# Patient Record
Sex: Female | Born: 1935 | Race: White | Hispanic: No | Marital: Single | Smoking: Never smoker
Health system: Southern US, Community
[De-identification: ages and names within clinical notes are randomized; demographics above are authoritative.]

## PROBLEM LIST (undated history)

## (undated) DIAGNOSIS — N200 Calculus of kidney: Secondary | ICD-10-CM

## (undated) DIAGNOSIS — E119 Type 2 diabetes mellitus without complications: Secondary | ICD-10-CM

## (undated) DIAGNOSIS — M1990 Unspecified osteoarthritis, unspecified site: Secondary | ICD-10-CM

## (undated) DIAGNOSIS — M109 Gout, unspecified: Secondary | ICD-10-CM

## (undated) DIAGNOSIS — I10 Essential (primary) hypertension: Secondary | ICD-10-CM

## (undated) DIAGNOSIS — E785 Hyperlipidemia, unspecified: Secondary | ICD-10-CM

## (undated) HISTORY — PX: TONSILLECTOMY: SUR1361

## (undated) HISTORY — PX: APPENDECTOMY: SHX54

## (undated) HISTORY — PX: CHOLECYSTECTOMY: SHX55

---

## 2003-11-26 ENCOUNTER — Other Ambulatory Visit: Payer: Self-pay

## 2004-10-17 ENCOUNTER — Other Ambulatory Visit: Payer: Self-pay

## 2004-10-17 ENCOUNTER — Emergency Department: Payer: Self-pay | Admitting: Emergency Medicine

## 2005-03-04 ENCOUNTER — Ambulatory Visit: Payer: Self-pay | Admitting: Internal Medicine

## 2005-03-09 ENCOUNTER — Ambulatory Visit: Payer: Self-pay | Admitting: Internal Medicine

## 2005-04-27 ENCOUNTER — Ambulatory Visit: Payer: Self-pay | Admitting: Internal Medicine

## 2006-04-05 ENCOUNTER — Ambulatory Visit: Payer: Self-pay | Admitting: Internal Medicine

## 2006-09-18 ENCOUNTER — Emergency Department: Payer: Self-pay | Admitting: Internal Medicine

## 2007-04-06 ENCOUNTER — Emergency Department: Payer: Self-pay | Admitting: Emergency Medicine

## 2007-04-06 ENCOUNTER — Other Ambulatory Visit: Payer: Self-pay

## 2007-04-11 ENCOUNTER — Ambulatory Visit: Payer: Self-pay | Admitting: Internal Medicine

## 2007-05-20 ENCOUNTER — Other Ambulatory Visit: Payer: Self-pay

## 2007-05-20 ENCOUNTER — Emergency Department: Payer: Self-pay | Admitting: Unknown Physician Specialty

## 2007-09-07 ENCOUNTER — Emergency Department: Payer: Self-pay | Admitting: Emergency Medicine

## 2007-09-11 ENCOUNTER — Ambulatory Visit: Payer: Self-pay | Admitting: Urology

## 2007-09-13 ENCOUNTER — Emergency Department: Payer: Self-pay | Admitting: Emergency Medicine

## 2007-12-25 ENCOUNTER — Ambulatory Visit: Payer: Self-pay | Admitting: Internal Medicine

## 2007-12-29 ENCOUNTER — Other Ambulatory Visit: Payer: Self-pay

## 2007-12-29 ENCOUNTER — Emergency Department: Payer: Self-pay | Admitting: Emergency Medicine

## 2008-04-11 ENCOUNTER — Ambulatory Visit: Payer: Self-pay | Admitting: Internal Medicine

## 2008-10-01 ENCOUNTER — Emergency Department: Payer: Self-pay | Admitting: Emergency Medicine

## 2008-11-12 ENCOUNTER — Ambulatory Visit: Payer: Self-pay | Admitting: Unknown Physician Specialty

## 2009-04-17 ENCOUNTER — Ambulatory Visit: Payer: Self-pay | Admitting: Internal Medicine

## 2009-12-03 ENCOUNTER — Ambulatory Visit: Payer: Self-pay | Admitting: Ophthalmology

## 2010-02-25 ENCOUNTER — Ambulatory Visit: Payer: Self-pay | Admitting: Ophthalmology

## 2010-03-10 ENCOUNTER — Ambulatory Visit: Payer: Self-pay | Admitting: Internal Medicine

## 2010-03-31 ENCOUNTER — Ambulatory Visit: Payer: Self-pay | Admitting: Unknown Physician Specialty

## 2010-04-13 ENCOUNTER — Ambulatory Visit: Payer: Self-pay | Admitting: Internal Medicine

## 2010-04-21 ENCOUNTER — Ambulatory Visit: Payer: Self-pay | Admitting: Internal Medicine

## 2010-09-22 ENCOUNTER — Emergency Department: Payer: Self-pay | Admitting: Emergency Medicine

## 2010-11-24 ENCOUNTER — Emergency Department: Payer: Self-pay | Admitting: Emergency Medicine

## 2011-02-16 ENCOUNTER — Ambulatory Visit: Payer: Self-pay

## 2011-06-01 ENCOUNTER — Ambulatory Visit: Payer: Self-pay | Admitting: Internal Medicine

## 2012-06-02 ENCOUNTER — Ambulatory Visit: Payer: Self-pay | Admitting: Internal Medicine

## 2012-07-17 ENCOUNTER — Emergency Department: Payer: Self-pay | Admitting: Emergency Medicine

## 2012-10-06 ENCOUNTER — Emergency Department: Payer: Self-pay | Admitting: Emergency Medicine

## 2012-10-06 LAB — URINALYSIS, COMPLETE
Glucose,UR: NEGATIVE mg/dL (ref 0–75)
Ketone: NEGATIVE
Nitrite: NEGATIVE
Ph: 5 (ref 4.5–8.0)
Protein: NEGATIVE
RBC,UR: 1 /HPF (ref 0–5)
Specific Gravity: 1.015 (ref 1.003–1.030)
Squamous Epithelial: 4
WBC UR: 9 /HPF (ref 0–5)

## 2012-10-06 LAB — COMPREHENSIVE METABOLIC PANEL
Alkaline Phosphatase: 104 U/L (ref 50–136)
Bilirubin,Total: 0.3 mg/dL (ref 0.2–1.0)
Calcium, Total: 9.1 mg/dL (ref 8.5–10.1)
Chloride: 107 mmol/L (ref 98–107)
Co2: 20 mmol/L — ABNORMAL LOW (ref 21–32)
Creatinine: 1.56 mg/dL — ABNORMAL HIGH (ref 0.60–1.30)
EGFR (African American): 36 — ABNORMAL LOW
Potassium: 4.2 mmol/L (ref 3.5–5.1)
SGOT(AST): 22 U/L (ref 15–37)
SGPT (ALT): 19 U/L (ref 12–78)
Total Protein: 8.2 g/dL (ref 6.4–8.2)

## 2012-10-06 LAB — CBC
HCT: 42.5 % (ref 35.0–47.0)
MCH: 29.1 pg (ref 26.0–34.0)
MCHC: 32.4 g/dL (ref 32.0–36.0)
Platelet: 218 10*3/uL (ref 150–440)
WBC: 11.2 10*3/uL — ABNORMAL HIGH (ref 3.6–11.0)

## 2012-10-06 LAB — LIPASE, BLOOD: Lipase: 265 U/L (ref 73–393)

## 2012-10-09 ENCOUNTER — Emergency Department: Payer: Self-pay | Admitting: Emergency Medicine

## 2012-10-09 LAB — URINALYSIS, COMPLETE
Glucose,UR: NEGATIVE mg/dL (ref 0–75)
Nitrite: NEGATIVE
Ph: 5 (ref 4.5–8.0)
Protein: NEGATIVE

## 2012-10-09 LAB — COMPREHENSIVE METABOLIC PANEL
Albumin: 3.7 g/dL (ref 3.4–5.0)
Alkaline Phosphatase: 75 U/L (ref 50–136)
Creatinine: 1.58 mg/dL — ABNORMAL HIGH (ref 0.60–1.30)
EGFR (Non-African Amer.): 30 — ABNORMAL LOW
Glucose: 126 mg/dL — ABNORMAL HIGH (ref 65–99)
Osmolality: 282 (ref 275–301)
Potassium: 5 mmol/L (ref 3.5–5.1)
Total Protein: 7.8 g/dL (ref 6.4–8.2)

## 2012-10-09 LAB — CBC
MCH: 29.9 pg (ref 26.0–34.0)
MCHC: 33.4 g/dL (ref 32.0–36.0)
RBC: 4.42 10*6/uL (ref 3.80–5.20)
WBC: 9.2 10*3/uL (ref 3.6–11.0)

## 2012-10-10 LAB — TROPONIN I: Troponin-I: <0.02 ng/mL

## 2013-02-20 ENCOUNTER — Emergency Department: Payer: Self-pay | Admitting: Unknown Physician Specialty

## 2013-02-20 LAB — CBC
HCT: 36.7 % (ref 35.0–47.0)
MCV: 90 fL (ref 80–100)
RBC: 4.1 10*6/uL (ref 3.80–5.20)
WBC: 8.4 10*3/uL (ref 3.6–11.0)

## 2013-02-20 LAB — URINALYSIS, COMPLETE
Blood: NEGATIVE
Glucose,UR: NEGATIVE mg/dL (ref 0–75)
Hyaline Cast: 1
Ketone: NEGATIVE
Nitrite: NEGATIVE
Ph: 5 (ref 4.5–8.0)
Protein: NEGATIVE
Specific Gravity: 1.009 (ref 1.003–1.030)
Squamous Epithelial: 1
WBC UR: 1 /HPF (ref 0–5)

## 2013-02-20 LAB — COMPREHENSIVE METABOLIC PANEL
Albumin: 3.6 g/dL (ref 3.4–5.0)
Chloride: 111 mmol/L — ABNORMAL HIGH (ref 98–107)
Creatinine: 2.01 mg/dL — ABNORMAL HIGH (ref 0.60–1.30)
EGFR (Non-African Amer.): 23 — ABNORMAL LOW
Glucose: 159 mg/dL — ABNORMAL HIGH (ref 65–99)
Osmolality: 292 (ref 275–301)
Potassium: 4.1 mmol/L (ref 3.5–5.1)
SGOT(AST): 23 U/L (ref 15–37)
SGPT (ALT): 18 U/L (ref 12–78)
Total Protein: 7.4 g/dL (ref 6.4–8.2)

## 2013-02-20 LAB — TROPONIN I: Troponin-I: <0.02 ng/mL

## 2013-02-20 LAB — PRO B NATRIURETIC PEPTIDE: B-Type Natriuretic Peptide: 127 pg/mL (ref 0–450)

## 2013-03-08 ENCOUNTER — Emergency Department: Payer: Self-pay | Admitting: Emergency Medicine

## 2013-06-04 ENCOUNTER — Ambulatory Visit: Payer: Self-pay | Admitting: Internal Medicine

## 2014-06-10 ENCOUNTER — Ambulatory Visit: Payer: Self-pay | Admitting: Internal Medicine

## 2015-05-19 ENCOUNTER — Other Ambulatory Visit: Payer: Self-pay | Admitting: Internal Medicine

## 2015-05-19 DIAGNOSIS — Z1231 Encounter for screening mammogram for malignant neoplasm of breast: Secondary | ICD-10-CM

## 2015-06-12 ENCOUNTER — Ambulatory Visit: Payer: Self-pay

## 2015-06-23 ENCOUNTER — Other Ambulatory Visit: Payer: Self-pay | Admitting: Internal Medicine

## 2015-06-23 ENCOUNTER — Ambulatory Visit
Admission: RE | Admit: 2015-06-23 | Discharge: 2015-06-23 | Disposition: A | Discharge status: Home / Self Care | Payer: Medicare Other | Source: Ambulatory Visit | Attending: Internal Medicine | Admitting: Internal Medicine

## 2015-06-23 DIAGNOSIS — Z1231 Encounter for screening mammogram for malignant neoplasm of breast: Secondary | ICD-10-CM

## 2015-06-24 ENCOUNTER — Ambulatory Visit: Payer: Self-pay

## 2015-07-15 ENCOUNTER — Emergency Department
Admission: EM | Admit: 2015-07-15 | Discharge: 2015-07-15 | Disposition: A | Discharge status: Home / Self Care | Payer: Medicare Other | Attending: Emergency Medicine | Admitting: Emergency Medicine

## 2015-07-15 ENCOUNTER — Encounter: Payer: Self-pay | Admitting: *Deleted

## 2015-07-15 ENCOUNTER — Emergency Department: Payer: Medicare Other

## 2015-07-15 DIAGNOSIS — I10 Essential (primary) hypertension: Secondary | ICD-10-CM | POA: Diagnosis not present

## 2015-07-15 DIAGNOSIS — Z79899 Other long term (current) drug therapy: Secondary | ICD-10-CM | POA: Diagnosis not present

## 2015-07-15 DIAGNOSIS — Z87891 Personal history of nicotine dependence: Secondary | ICD-10-CM | POA: Diagnosis not present

## 2015-07-15 DIAGNOSIS — S0083XA Contusion of other part of head, initial encounter: Secondary | ICD-10-CM | POA: Diagnosis not present

## 2015-07-15 DIAGNOSIS — Z7982 Long term (current) use of aspirin: Secondary | ICD-10-CM | POA: Insufficient documentation

## 2015-07-15 DIAGNOSIS — Y9289 Other specified places as the place of occurrence of the external cause: Secondary | ICD-10-CM | POA: Diagnosis not present

## 2015-07-15 DIAGNOSIS — R55 Syncope and collapse: Secondary | ICD-10-CM | POA: Diagnosis present

## 2015-07-15 DIAGNOSIS — R42 Dizziness and giddiness: Secondary | ICD-10-CM | POA: Diagnosis not present

## 2015-07-15 DIAGNOSIS — Y998 Other external cause status: Secondary | ICD-10-CM | POA: Diagnosis not present

## 2015-07-15 DIAGNOSIS — Y9389 Activity, other specified: Secondary | ICD-10-CM | POA: Insufficient documentation

## 2015-07-15 DIAGNOSIS — W01198A Fall on same level from slipping, tripping and stumbling with subsequent striking against other object, initial encounter: Secondary | ICD-10-CM | POA: Insufficient documentation

## 2015-07-15 DIAGNOSIS — N390 Urinary tract infection, site not specified: Secondary | ICD-10-CM | POA: Insufficient documentation

## 2015-07-15 DIAGNOSIS — Z880 Allergy status to penicillin: Secondary | ICD-10-CM | POA: Diagnosis not present

## 2015-07-15 HISTORY — DX: Essential (primary) hypertension: I10

## 2015-07-15 HISTORY — DX: Gout, unspecified: M10.9

## 2015-07-15 HISTORY — DX: Calculus of kidney: N20.0

## 2015-07-15 LAB — CBC
HCT: 36.3 % (ref 35.0–47.0)
HEMOGLOBIN: 11.9 g/dL — AB (ref 12.0–16.0)
MCH: 29.6 pg (ref 26.0–34.0)
MCHC: 32.8 g/dL (ref 32.0–36.0)
MCV: 90.4 fL (ref 80.0–100.0)
Platelets: 188 10*3/uL (ref 150–440)
RBC: 4.01 MIL/uL (ref 3.80–5.20)
RDW: 14.2 % (ref 11.5–14.5)
WBC: 7.2 10*3/uL (ref 3.6–11.0)

## 2015-07-15 LAB — URINALYSIS COMPLETE WITH MICROSCOPIC (ARMC ONLY)
BILIRUBIN URINE: NEGATIVE
Glucose, UA: NEGATIVE mg/dL
HGB URINE DIPSTICK: NEGATIVE
Ketones, ur: NEGATIVE mg/dL
Nitrite: NEGATIVE
PH: 5 (ref 5.0–8.0)
PROTEIN: NEGATIVE mg/dL
Specific Gravity, Urine: 1.015 (ref 1.005–1.030)

## 2015-07-15 LAB — TROPONIN I

## 2015-07-15 LAB — BASIC METABOLIC PANEL
ANION GAP: 8 (ref 5–15)
BUN: 33 mg/dL — ABNORMAL HIGH (ref 6–20)
CALCIUM: 9.6 mg/dL (ref 8.9–10.3)
CHLORIDE: 110 mmol/L (ref 101–111)
CO2: 20 mmol/L — AB (ref 22–32)
CREATININE: 1.47 mg/dL — AB (ref 0.44–1.00)
GFR calc Af Amer: 37 mL/min — ABNORMAL LOW (ref >60)
GFR calc non Af Amer: 32 mL/min — ABNORMAL LOW (ref >60)
GLUCOSE: 168 mg/dL — AB (ref 65–99)
Potassium: 4.3 mmol/L (ref 3.5–5.1)
Sodium: 138 mmol/L (ref 135–145)

## 2015-07-15 MED ORDER — MECLIZINE HCL 25 MG PO TABS: 25.0000 mg | ORAL_TABLET | Freq: Three times a day (TID) | ORAL | Status: AC | PRN

## 2015-07-15 MED ORDER — LEVOFLOXACIN 750 MG PO TABS: 750.0000 mg | ORAL_TABLET | ORAL | Status: AC

## 2015-07-15 MED ORDER — DIAZEPAM 5 MG PO TABS
2.5000 mg | ORAL_TABLET | Freq: Once | ORAL | Status: AC
  Administered 2015-07-15: 2.5 mg via ORAL

## 2015-07-15 MED ORDER — LORAZEPAM 1 MG PO TABS
1.0000 mg | ORAL_TABLET | Freq: Once | ORAL | Status: DC
  Filled 2015-07-15: qty 1

## 2015-07-15 MED ORDER — DIAZEPAM 5 MG PO TABS
ORAL_TABLET | ORAL | Status: AC
  Filled 2015-07-15: qty 1

## 2015-07-15 MED ORDER — LEVOFLOXACIN 750 MG PO TABS
750.0000 mg | ORAL_TABLET | Freq: Once | ORAL | Status: AC
  Administered 2015-07-15: 750 mg via ORAL
  Filled 2015-07-15: qty 1

## 2015-07-15 NOTE — ED Notes (Signed)

## 2015-07-15 NOTE — ED Notes (Signed)
Pt escorted to MRI by [facility]

## 2015-07-15 NOTE — ED Notes (Signed)
Yesterday fell hitting head on something, does not know why,has contunsion on forehead,yesterday she woke up sitting in chair, no problems rest of the day, has slight nauseahas off and on chronic off balance, is on asa

## 2015-07-15 NOTE — ED Provider Notes (Signed)
[facility] Emergency Department Provider Note  ____________________________________________  Time seen: Approximately 615 PM  I have reviewed the triage vital signs and the nursing notes.   HISTORY  Chief Complaint Loss of Consciousness    HPI DOMANIQUE HUESMAN is a 81 y.o. female with a history of hypertension and kidney stones was presenting today with a fall yesterday. The patient says that yesterday upon waking she felt off-balance with a "funny feeling" in her head. She had that she was walking the refrigerator and then the next thing she knew she awoke in a chair in a different room. She says that she thinks she fell because she has a bump to her forehead. She denies losing any bowel or bladder continence. Denies any weakness or numbness at this time. Denies any chest pain or shortness of breath but does not recall the events just prior to the episode. She denies any headache. Denies any dizziness. She says that she has been off balance in the past and walks with a cane or a baseline. However her granddaughter is at the bedside and says that over the past 1-2 weeks patient is also had some memory loss. She says that the patient will call her several times a day to tell her the same thing. She says that this is new for the patient.Patient also reporting 1 week of nasal congestion with cough.   Past Medical History  Diagnosis Date  . Hypertension   . Kidney calculus   . Kidney stones   . Gout     There are no active problems to display for this patient.   Past Surgical History  Procedure Laterality Date  . Cholecystectomy    . Appendectomy      Current Outpatient Rx  Name  Route  Sig  Dispense  Refill  . allopurinol (ZYLOPRIM) 300 MG tablet   Oral   Take 300 mg by mouth daily.         Marland Kitchen aspirin EC 81 MG tablet   Oral   Take 81 mg by mouth daily.         . Cholecalciferol (VITAMIN D3) 2000 UNITS TABS   Oral   Take 2,000 Units by mouth at  bedtime.         . Coconut Oil (EQL COCONUT OIL) 1000 MG CAPS   Oral   Take 1,000 mg by mouth daily.         . diazepam (VALIUM) 5 MG tablet   Oral   Take 2.5 mg by mouth at bedtime as needed (for sleep).         Marland Kitchen glipiZIDE (GLUCOTROL XL) 5 MG 24 hr tablet   Oral   Take 5 mg by mouth daily.         Marland Kitchen ipratropium (ATROVENT) 0.03 % nasal spray   Each Nare   Place 2 sprays into both nostrils 2 (two) times daily.         Marland Kitchen ketoconazole (NIZORAL) 2 % shampoo   Topical   Apply 1 application topically every 14 (fourteen) days.         Marland Kitchen levothyroxine (SYNTHROID, LEVOTHROID) 25 MCG tablet   Oral   Take 25 mcg by mouth daily before breakfast.         . lisinopril (PRINIVIL,ZESTRIL) 20 MG tablet   Oral   Take 20 mg by mouth daily.         . metoprolol (LOPRESSOR) 50 MG tablet   Oral  Take 50 mg by mouth 2 (two) times daily.         . mometasone (ELOCON) 0.1 % lotion   Topical   Apply 1 application topically daily as needed (for itching on ears).         . Multiple Vitamin (MULTIVITAMIN WITH MINERALS) TABS tablet   Oral   Take 1 tablet by mouth daily.         . Omega-3 Fatty Acids (FISH OIL) 1000 MG CAPS   Oral   Take 1,000 mg by mouth 3 (three) times daily.         Marland Kitchen omeprazole (PRILOSEC) 20 MG capsule   Oral   Take 20 mg by mouth 2 (two) times daily.         . Red Yeast Rice 600 MG CAPS   Oral   Take 600 mg by mouth daily.         . sertraline (ZOLOFT) 25 MG tablet   Oral   Take 25 mg by mouth daily.           Allergies Demerol and Penicillins  Family History  Problem Relation Age of Onset  . Breast cancer Neg Hx     Social History Social History  Substance Use Topics  . Smoking status: Former Games developer  . Smokeless tobacco: None  . Alcohol Use: No    Review of Systems Constitutional: No fever/chills Eyes: No visual changes. ENT: No sore throat. Cardiovascular: Denies chest pain. Respiratory: Denies shortness of  breath. Gastrointestinal: No abdominal pain.  No nausea, no vomiting.  No diarrhea.  No constipation. Genitourinary: Negative for dysuria. Musculoskeletal: Negative for back pain. Skin: Negative for rash. Neurological: Negative for headaches, focal weakness or numbness.  10-point ROS otherwise negative.  ____________________________________________   PHYSICAL EXAM:  VITAL SIGNS: ED Triage Vitals  Enc Vitals Group     BP 07/15/15 1154 154/68 mmHg     Pulse Rate 07/15/15 1154 73     Resp 07/15/15 1154 20     Temp 07/15/15 1154 98.1 F (36.7 C)     Temp Source 07/15/15 1154 Oral     SpO2 07/15/15 1154 96 %     Weight 07/15/15 1154 185 lb (83.915 kg)     Height 07/15/15 1154  (1.626 m)     Head Cir --      Peak Flow --      Pain Score 07/15/15 1155 3     Pain Loc --      Pain Edu? --      Excl. in GC? --     Constitutional: Alert and oriented. Well appearing and in no acute distress. Eyes: Conjunctivae are normal. PERRL. EOMI. Head: Small hematoma which is 2 x 3 cm the left forehead. There is no abrasion overlying. No bogginess or skull depression. Normal TMs bilaterally. Nose: Small amount of congestion to the bilateral naris Mouth/Throat: Mucous membranes are moist.  Oropharynx non-erythematous. Neck: No stridor.   Cardiovascular: Normal rate, regular rhythm. Grossly normal heart sounds.  Good peripheral circulation. Respiratory: Normal respiratory effort.  No retractions. Lungs CTAB. Gastrointestinal: Soft and nontender. No distention. No abdominal bruits. No CVA tenderness. Musculoskeletal: No lower extremity tenderness nor edema.  No joint effusions. Neurologic:  Normal speech and language. No gross focal neurologic deficits are appreciated. No ataxia on finger to nose testing as well as heel-to-shin movements. Patient able to walk without any assistance but when stops she says that she feels off balance and starts  to waver.  Skin:  Skin is warm, dry and intact. No  rash noted. Psychiatric: Mood and affect are normal. Speech and behavior are normal.  NIH Stroke Scale   Person Administering Scale: Arelia Longest  Administer stroke scale items in the order listed. Record performance in each category after each subscale exam. Do not go back and change scores. Follow directions provided for each exam technique. Scores should reflect what the patient does, not what the clinician thinks the patient can do. The clinician should record answers while administering the exam and work quickly. Except where indicated, the patient should not be coached (i.e., repeated requests to patient to make a special effort).   1a  Level of consciousness: 0=alert; keenly responsive  1b. LOC questions:  0=Performs both tasks correctly  1c. LOC commands: 0=Performs both tasks correctly  2.  Best Gaze: 0=normal  3.  Visual: 0=No visual loss  4. Facial Palsy: 0=Normal symmetric movement  5a.  Motor left arm: 0=No drift, limb holds 90 (or 45) degrees for full 10 seconds  5b.  Motor right arm: 0=No drift, limb holds 90 (or 45) degrees for full 10 seconds  6a. motor left leg: 0=No drift, limb holds 90 (or 45) degrees for full 10 seconds  6b  Motor right leg:  0=No drift, limb holds 90 (or 45) degrees for full 10 seconds  7. Limb Ataxia: 0=Absent  8.  Sensory: 0=Normal; no sensory loss  9. Best Language:  0=No aphasia, normal  10. Dysarthria: 0=Normal  11. Extinction and Inattention: 0=No abnormality  12. Distal motor function: 0=Normal   Total:   0   ____________________________________________   LABS (all labs ordered are listed, but only abnormal results are displayed)  Labs Reviewed  BASIC METABOLIC PANEL - Abnormal; Notable for the following:    CO2 20 (*)    Glucose, Bld 168 (*)    BUN 33 (*)    Creatinine, Ser 1.47 (*)    GFR calc non Af Amer 32 (*)    GFR calc Af Amer 37 (*)    All other components within normal limits  CBC - Abnormal; Notable for the  following:    Hemoglobin 11.9 (*)    All other components within normal limits  URINALYSIS COMPLETEWITH MICROSCOPIC ([facility] ONLY) - Abnormal; Notable for the following:    Color, Urine YELLOW (*)    APPearance CLOUDY (*)    Leukocytes, UA 3+ (*)    Bacteria, UA MANY (*)    Squamous Epithelial / LPF 6-30 (*)    All other components within normal limits  URINE CULTURE  TROPONIN I   ____________________________________________  EKG  ED ECG REPORT I, Arelia Longest, the attending physician, personally viewed and interpreted this ECG.   Date: 07/15/2015  EKG Time: 1202  Rate: 74  Rhythm: normal sinus rhythm, P waves seen in V2 as well as V3.  Axis: Left axis deviation  Intervals:none  ST&T Change: T wave inversions in V2 as well as V3 which are not changed from 02/20/2013.  ____________________________________________  RADIOLOGY  CT of the brain without any acute intracranial abnormality.  Chest x-ray without any acute infiltrate.  MRI without intracranial acute abnormality. ____________________________________________   PROCEDURES    ____________________________________________   INITIAL IMPRESSION / ASSESSMENT AND PLAN / ED COURSE  Pertinent labs & imaging results that were available during my care of the patient were reviewed by me and considered in my medical decision making (see chart for  details).  Patient out of the window for TPA given the symptoms started greater than 24 hours ago but also has a NIH score of 0 at this time.  ----------------------------------------- 7:15 PM on 07/15/2015 ----------------------------------------- Patient says she is dizzy episode while getting up from the table and MRI. However it is resolved at this time. I discussed with her the findings of her workup. It appears that she has a urinary tract infection. She said she just had a urinary tract infection about 1-2 months ago but has been struggling with this issue since  she has been a teenager. She says that she usually takes Levaquin which alleviates her symptoms. I will give her a course of Levaquin to go home with. I will also discharge her with a prescription for meclizine. The diagnosis of urinary tract infection or bleed makes clinical sense because of her confusion over the past week. She'll be staying with her granddaughter for the next several days until her symptoms improved. She will also follow with her doctor Dr. Marcello FennelHande.    ____________________________________________   FINAL CLINICAL IMPRESSION(S) / ED DIAGNOSES  Urinary tract infection.    Myrna Blazeravid Matthew Anevay Campanella, MD 07/15/15 [phone]

## 2015-07-15 NOTE — ED Notes (Signed)
Pt taken to XRay 

## 2015-07-17 LAB — URINE CULTURE

## 2015-11-03 ENCOUNTER — Emergency Department: Payer: Medicare Other

## 2015-11-03 ENCOUNTER — Emergency Department
Admission: EM | Admit: 2015-11-03 | Discharge: 2015-11-03 | Disposition: A | Discharge status: Home / Self Care | Payer: Medicare Other | Attending: Emergency Medicine | Admitting: Emergency Medicine

## 2015-11-03 DIAGNOSIS — Z7952 Long term (current) use of systemic steroids: Secondary | ICD-10-CM | POA: Insufficient documentation

## 2015-11-03 DIAGNOSIS — R112 Nausea with vomiting, unspecified: Secondary | ICD-10-CM

## 2015-11-03 DIAGNOSIS — Z880 Allergy status to penicillin: Secondary | ICD-10-CM | POA: Insufficient documentation

## 2015-11-03 DIAGNOSIS — Z7982 Long term (current) use of aspirin: Secondary | ICD-10-CM | POA: Insufficient documentation

## 2015-11-03 DIAGNOSIS — Z87891 Personal history of nicotine dependence: Secondary | ICD-10-CM | POA: Diagnosis not present

## 2015-11-03 DIAGNOSIS — Z79899 Other long term (current) drug therapy: Secondary | ICD-10-CM | POA: Diagnosis not present

## 2015-11-03 DIAGNOSIS — R1084 Generalized abdominal pain: Secondary | ICD-10-CM | POA: Diagnosis not present

## 2015-11-03 DIAGNOSIS — Z7984 Long term (current) use of oral hypoglycemic drugs: Secondary | ICD-10-CM | POA: Diagnosis not present

## 2015-11-03 DIAGNOSIS — I10 Essential (primary) hypertension: Secondary | ICD-10-CM | POA: Diagnosis not present

## 2015-11-03 DIAGNOSIS — R197 Diarrhea, unspecified: Secondary | ICD-10-CM | POA: Insufficient documentation

## 2015-11-03 LAB — COMPREHENSIVE METABOLIC PANEL
ALK PHOS: 60 U/L (ref 38–126)
ALT: 11 U/L — AB (ref 14–54)
AST: 21 U/L (ref 15–41)
Albumin: 4.3 g/dL (ref 3.5–5.0)
Anion gap: 9 (ref 5–15)
BUN: 36 mg/dL — AB (ref 6–20)
CALCIUM: 9.8 mg/dL (ref 8.9–10.3)
CO2: 24 mmol/L (ref 22–32)
CREATININE: 1.7 mg/dL — AB (ref 0.44–1.00)
Chloride: 108 mmol/L (ref 101–111)
GFR calc non Af Amer: 26 mL/min — ABNORMAL LOW (ref >60)
GFR, EST AFRICAN AMERICAN: 31 mL/min — AB (ref >60)
GLUCOSE: 173 mg/dL — AB (ref 65–99)
Potassium: 4.9 mmol/L (ref 3.5–5.1)
SODIUM: 141 mmol/L (ref 135–145)
Total Bilirubin: 0.5 mg/dL (ref 0.3–1.2)
Total Protein: 7.8 g/dL (ref 6.5–8.1)

## 2015-11-03 LAB — CBC
HCT: 40.8 % (ref 35.0–47.0)
Hemoglobin: 13.3 g/dL (ref 12.0–16.0)
MCH: 29.9 pg (ref 26.0–34.0)
MCHC: 32.7 g/dL (ref 32.0–36.0)
MCV: 91.4 fL (ref 80.0–100.0)
PLATELETS: 190 10*3/uL (ref 150–440)
RBC: 4.47 MIL/uL (ref 3.80–5.20)
RDW: 14.1 % (ref 11.5–14.5)
WBC: 9.8 10*3/uL (ref 3.6–11.0)

## 2015-11-03 LAB — TROPONIN I

## 2015-11-03 LAB — LIPASE, BLOOD: Lipase: 36 U/L (ref 11–51)

## 2015-11-03 MED ORDER — SODIUM CHLORIDE 0.9 % IV BOLUS (SEPSIS)
1000.0000 mL | Freq: Once | INTRAVENOUS | Status: AC
  Administered 2015-11-03: 1000 mL via INTRAVENOUS

## 2015-11-03 MED ORDER — IOHEXOL 240 MG/ML SOLN: 25.0000 mL | INTRAMUSCULAR | Status: AC

## 2015-11-03 NOTE — ED Provider Notes (Signed)
[facility] Emergency Department Provider Note  ____________________________________________  Time seen: Seen upon arrival to the emergency department  I have reviewed the triage vital signs and the nursing notes.   HISTORY  Chief Complaint Diarrhea and Emesis   HPI Victoria Edwards is a 82 y.o. female with a history of hypertension and kidney stones is presenting to the emergency department today with nausea vomiting, diarrhea and abdominal pain over the past one hour. She says she has had about 5 episodes of diarrhea which were watery over the past hour. She is also a 2-3 episodes of vomiting. She denies any blood in her diarrhea or vomitus. She says that she has had abdominal cramping which is especially pronounced in the right upper quadrant throughout the day. She has no known sick contacts. No recent antibiotics. Did receive a pneumonia shot recently. Denies any body aches. Says that the pain is cramping and mild without any radiation.   Past Medical History  Diagnosis Date  . Hypertension   . Kidney calculus   . Kidney stones   . Gout     There are no active problems to display for this patient.   Past Surgical History  Procedure Laterality Date  . Cholecystectomy    . Appendectomy      Current Outpatient Rx  Name  Route  Sig  Dispense  Refill  . allopurinol (ZYLOPRIM) 300 MG tablet   Oral   Take 300 mg by mouth daily.         Marland Kitchen. aspirin EC 81 MG tablet   Oral   Take 81 mg by mouth daily.         . Cholecalciferol (VITAMIN D3) 2000 UNITS TABS   Oral   Take 2,000 Units by mouth at bedtime.         . Coconut Oil (EQL COCONUT OIL) 1000 MG CAPS   Oral   Take 1,000 mg by mouth daily.         . diazepam (VALIUM) 5 MG tablet   Oral   Take 2.5 mg by mouth at bedtime as needed (for sleep).         Marland Kitchen. glipiZIDE (GLUCOTROL XL) 5 MG 24 hr tablet   Oral   Take 5 mg by mouth daily.         Marland Kitchen. ipratropium (ATROVENT) 0.03 % nasal  spray   Each Nare   Place 2 sprays into both nostrils 2 (two) times daily.         Marland Kitchen. ketoconazole (NIZORAL) 2 % shampoo   Topical   Apply 1 application topically every 14 (fourteen) days.         Marland Kitchen. levothyroxine (SYNTHROID, LEVOTHROID) 25 MCG tablet   Oral   Take 25 mcg by mouth daily before breakfast.         . lisinopril (PRINIVIL,ZESTRIL) 20 MG tablet   Oral   Take 20 mg by mouth daily.         . meclizine (ANTIVERT) 25 MG tablet   Oral   Take 1 tablet (25 mg total) by mouth 3 (three) times daily as needed for dizziness.   30 tablet   0   . metoprolol (LOPRESSOR) 50 MG tablet   Oral   Take 50 mg by mouth 2 (two) times daily.         . mometasone (ELOCON) 0.1 % lotion   Topical   Apply 1 application topically daily as needed (for itching on ears).         .Marland Kitchen  Multiple Vitamin (MULTIVITAMIN WITH MINERALS) TABS tablet   Oral   Take 1 tablet by mouth daily.         . Omega-3 Fatty Acids (FISH OIL) 1000 MG CAPS   Oral   Take 1,000 mg by mouth 3 (three) times daily.         Marland Kitchen omeprazole (PRILOSEC) 20 MG capsule   Oral   Take 20 mg by mouth 2 (two) times daily.         . Red Yeast Rice 600 MG CAPS   Oral   Take 600 mg by mouth daily.         . sertraline (ZOLOFT) 25 MG tablet   Oral   Take 25 mg by mouth daily.           Allergies Demerol and Penicillins  Family History  Problem Relation Age of Onset  . Breast cancer Neg Hx     Social History Social History  Substance Use Topics  . Smoking status: Former Games developer  . Smokeless tobacco: None  . Alcohol Use: No    Review of Systems Constitutional: No fever/chills Eyes: No visual changes. ENT: No sore throat. Cardiovascular: Denies chest pain. Respiratory: Denies shortness of breath. Gastrointestinal: No constipation. Genitourinary: Negative for dysuria. Musculoskeletal: Negative for back pain. Skin: Negative for rash. Neurological: Negative for headaches, focal weakness or  numbness.  10-point ROS otherwise negative.  ____________________________________________   PHYSICAL EXAM:  VITAL SIGNS: ED Triage Vitals  Enc Vitals Group     BP 11/03/15 1933 143/54 mmHg     Pulse Rate 11/03/15 1933 86     Resp 11/03/15 1933 16     Temp 11/03/15 1933 97.8 F (36.6 C)     Temp Source 11/03/15 1933 Oral     SpO2 11/03/15 1933 96 %     Weight 11/03/15 1933 175 lb (79.379 kg)     Height 11/03/15 1933  (1.626 m)     Head Cir --      Peak Flow --      Pain Score 11/03/15 1938 0     Pain Loc --      Pain Edu? --      Excl. in GC? --     Constitutional: Alert and oriented. Well appearing and in no acute distress. Eyes: Conjunctivae are normal. PERRL. EOMI. Head: Atraumatic. Nose: No congestion/rhinnorhea. Mouth/Throat: Mucous membranes are moist.   Neck: No stridor.   Cardiovascular: Normal rate, regular rhythm. Grossly normal heart sounds.  Good peripheral circulation. Respiratory: Normal respiratory effort.  No retractions. Lungs CTAB. Gastrointestinal: Soft with mild diffuse abdominal tenderness which is more pronounced than right upper quadrant without a positive Murphy sign. No distention.  No CVA tenderness. Musculoskeletal: No lower extremity tenderness nor edema.  No joint effusions. Neurologic:  Normal speech and language. No gross focal neurologic deficits are appreciated.  Skin:  Skin is warm, dry and intact. No rash noted. Psychiatric: Mood and affect are normal. Speech and behavior are normal.  ____________________________________________   LABS (all labs ordered are listed, but only abnormal results are displayed)  Labs Reviewed  COMPREHENSIVE METABOLIC PANEL - Abnormal; Notable for the following:    Glucose, Bld 173 (*)    BUN 36 (*)    Creatinine, Ser 1.70 (*)    ALT 11 (*)    GFR calc non Af Amer 26 (*)    GFR calc Af Amer 31 (*)    All other components within normal limits  C DIFFICILE QUICK SCREEN W PCR REFLEX  LIPASE,  BLOOD  CBC  TROPONIN I  URINALYSIS COMPLETEWITH MICROSCOPIC ([facility] ONLY)   ____________________________________________  EKG  ED ECG REPORT I, Schaevitz,  Teena Irani, the attending physician, personally viewed and interpreted this ECG.   Date: 11/03/2015  EKG Time: 2020  Rate: 90  Rhythm: normal sinus rhythm. Likely P waves in V3. Poor baseline likely obscuring P waves. Also evident in lead 3.  Axis: Normal  Intervals:none  ST&T Change: No ST elevation or depression. T-wave inversions in leads V2 and V3.  No significant change from EKG done on 07/15/2015. ____________________________________________  RADIOLOGY  No acute abnormality found on the CAT scan of the abdomen and pelvis. ____________________________________________   PROCEDURES   ____________________________________________   INITIAL IMPRESSION / ASSESSMENT AND PLAN / ED COURSE  Pertinent labs & imaging results that were available during my care of the patient were reviewed by me and considered in my medical decision making (see chart for details).  ----------------------------------------- 10:30 PM on 11/03/2015 -----------------------------------------  Patient is resting comfortably at this time and feels much improved after IV fluids. She is able to keep down all the by mouth contrast and has not had any episodes of nausea, vomiting or diarrhea in the emergency department. She said that the symptoms started this afternoon after eating [place] fried chicken.  It is possible that this is related to food poisoning as the symptoms have completely resolved after a brief period of nausea vomiting and diarrhea. She'll be discharged home. We discussed dietary precautions including starting simple with clear fluids as well as toast and other bland foods. The patient's granddaughter is at the bedside and she also understands the plan. ____________________________________________   FINAL CLINICAL IMPRESSION(S) / ED  DIAGNOSES  Final diagnoses:  Nausea & vomiting   abdominal pain. Diarrhea.    Myrna Blazer, MD 11/03/15 2232

## 2015-11-03 NOTE — ED Notes (Signed)
Pt arrives to ED via [facility] d/t N/V/D since 1pm this afternoon. EMS reports pt had a pneumonia shot on Saturday and felt fine. Pt reports feeling a little worse on Saturday before s/x's started today. EMS reports 4mg  Zofran given IM en route. Pt reports 4 episodes of emesis in the last 90 mins; 6-7 episodes of diarrhea since 4pm this afternoon. Pt is A&O, in NAD, with respiration even, regular and unlabored.

## 2015-11-03 NOTE — ED Notes (Signed)
Patient finished contrast.  CT notified. Still waiting on lab results.

## 2015-11-03 NOTE — Discharge Instructions (Signed)
Abdominal Pain, Adult Many things can cause abdominal pain. Usually, abdominal pain is not caused by a disease and will improve without treatment. It can often be observed and treated at home. Your health care provider will do a physical exam and possibly order blood tests and X-rays to help determine the seriousness of your pain. However, in many cases, more time must pass before a clear cause of the pain can be found. Before that point, your health care provider may not know if you need more testing or further treatment. HOME CARE INSTRUCTIONS Monitor your abdominal pain for any changes. The following actions may help to alleviate any discomfort you are experiencing:  Only take over-the-counter or prescription medicines as directed by your health care provider.  Do not take laxatives unless directed to do so by your health care provider.  Try a clear liquid diet (broth, tea, or water) as directed by your health care provider. Slowly move to a bland diet as tolerated. SEEK MEDICAL CARE IF:  You have unexplained abdominal pain.  You have abdominal pain associated with nausea or diarrhea.  You have pain when you urinate or have a bowel movement.  You experience abdominal pain that wakes you in the night.  You have abdominal pain that is worsened or improved by eating food.  You have abdominal pain that is worsened with eating fatty foods.  You have a fever. SEEK IMMEDIATE MEDICAL CARE IF:  Your pain does not go away within 2 hours.  You keep throwing up (vomiting).  Your pain is felt only in portions of the abdomen, such as the right side or the left lower portion of the abdomen.  You pass bloody or black tarry stools. MAKE SURE YOU:  Understand these instructions.  Will watch your condition.  Will get help right away if you are not doing well or get worse.   This information is not intended to replace advice given to you by your health care provider. Make sure you discuss  any questions you have with your health care provider.   Document Released: 05/26/2005 Document Revised: 05/07/2015 Document Reviewed: 04/25/2013 Elsevier Interactive Patient Education 2016 Elsevier Inc.  Diarrhea Diarrhea is watery poop (stool). It can make you feel weak, tired, thirsty, or give you a dry mouth (signs of dehydration). Watery poop is a sign of another problem, most often an infection. It often lasts 2-3 days. It can last longer if it is a sign of something serious. Take care of yourself as told by your doctor. HOME CARE   Drink 1 cup (8 ounces) of fluid each time you have watery poop.  Do not drink the following fluids:  Those that contain simple sugars (fructose, glucose, galactose, lactose, sucrose, maltose).  Sports drinks.  Fruit juices.  Whole milk products.  Sodas.  Drinks with caffeine (coffee, tea, soda) or alcohol.  Oral rehydration solution may be used if the doctor says it is okay. You may make your own solution. Follow this recipe:   - teaspoon table salt.   teaspoon baking soda.   teaspoon salt substitute containing potassium chloride.  1 tablespoons sugar.  1 liter (34 ounces) of water.  Avoid the following foods:  High fiber foods, such as raw fruits and vegetables.  Nuts, seeds, and whole grain breads and cereals.   Those that are sweetened with sugar alcohols (xylitol, sorbitol, mannitol).  Try eating the following foods:  Starchy foods, such as rice, toast, pasta, low-sugar cereal, oatmeal, baked potatoes, crackers, and  bagels.  Bananas.  Applesauce.  Eat probiotic-rich foods, such as yogurt and milk products that are fermented.  Wash your hands well after each time you have watery poop.  Only take medicine as told by your doctor.  Take a warm bath to help lessen burning or pain from having watery poop. GET HELP RIGHT AWAY IF:   You cannot drink fluids without throwing up (vomiting).  You keep throwing up.  You  have blood in your poop, or your poop looks black and tarry.  You do not pee (urinate) in 6-8 hours, or there is only a small amount of very dark pee.  You have belly (abdominal) pain that gets worse or stays in the same spot (localizes).  You are weak, dizzy, confused, or light-headed.  You have a very bad headache.  Your watery poop gets worse or does not get better.  You have a fever or lasting symptoms for more than 2-3 days.  You have a fever and your symptoms suddenly get worse. MAKE SURE YOU:   Understand these instructions.  Will watch your condition.  Will get help right away if you are not doing well or get worse.   This information is not intended to replace advice given to you by your health care provider. Make sure you discuss any questions you have with your health care provider.   Document Released: 02/02/2008 Document Revised: 09/06/2014 Document Reviewed: 04/23/2012 Elsevier Interactive Patient Education 2016 Elsevier Inc.  Nausea and Vomiting Nausea is a sick feeling that often comes before throwing up (vomiting). Vomiting is a reflex where stomach contents come out of your mouth. Vomiting can cause severe loss of body fluids (dehydration). Children and elderly adults can become dehydrated quickly, especially if they also have diarrhea. Nausea and vomiting are symptoms of a condition or disease. It is important to find the cause of your symptoms. CAUSES   Direct irritation of the stomach lining. This irritation can result from increased acid production (gastroesophageal reflux disease), infection, food poisoning, taking certain medicines (such as nonsteroidal anti-inflammatory drugs), alcohol use, or tobacco use.  Signals from the brain.These signals could be caused by a headache, heat exposure, an inner ear disturbance, increased pressure in the brain from injury, infection, a tumor, or a concussion, pain, emotional stimulus, or metabolic problems.  An  obstruction in the gastrointestinal tract (bowel obstruction).  Illnesses such as diabetes, hepatitis, gallbladder problems, appendicitis, kidney problems, cancer, sepsis, atypical symptoms of a heart attack, or eating disorders.  Medical treatments such as chemotherapy and radiation.  Receiving medicine that makes you sleep (general anesthetic) during surgery. DIAGNOSIS Your caregiver may ask for tests to be done if the problems do not improve after a few days. Tests may also be done if symptoms are severe or if the reason for the nausea and vomiting is not clear. Tests may include:  Urine tests.  Blood tests.  Stool tests.  Cultures (to look for evidence of infection).  X-rays or other imaging studies. Test results can help your caregiver make decisions about treatment or the need for additional tests. TREATMENT You need to stay well hydrated. Drink frequently but in small amounts.You may wish to drink water, sports drinks, clear broth, or eat frozen ice pops or gelatin dessert to help stay hydrated.When you eat, eating slowly may help prevent nausea.There are also some antinausea medicines that may help prevent nausea. HOME CARE INSTRUCTIONS   Take all medicine as directed by your caregiver.  If you do not  have an appetite, do not force yourself to eat. However, you must continue to drink fluids.  If you have an appetite, eat a normal diet unless your caregiver tells you differently.  Eat a variety of complex carbohydrates (rice, wheat, potatoes, bread), lean meats, yogurt, fruits, and vegetables.  Avoid high-fat foods because they are more difficult to digest.  Drink enough water and fluids to keep your urine clear or pale yellow.  If you are dehydrated, ask your caregiver for specific rehydration instructions. Signs of dehydration may include:  Severe thirst.  Dry lips and mouth.  Dizziness.  Dark urine.  Decreasing urine frequency and  amount.  Confusion.  Rapid breathing or pulse. SEEK IMMEDIATE MEDICAL CARE IF:   You have blood or brown flecks (like coffee grounds) in your vomit.  You have black or bloody stools.  You have a severe headache or stiff neck.  You are confused.  You have severe abdominal pain.  You have chest pain or trouble breathing.  You do not urinate at least once every 8 hours.  You develop cold or clammy skin.  You continue to vomit for longer than 24 to 48 hours.  You have a fever. MAKE SURE YOU:   Understand these instructions.  Will watch your condition.  Will get help right away if you are not doing well or get worse.   This information is not intended to replace advice given to you by your health care provider. Make sure you discuss any questions you have with your health care provider.   Document Released: 08/16/2005 Document Revised: 11/08/2011 Document Reviewed: 01/13/2011 Elsevier Interactive Patient Education [employer]

## 2015-11-03 NOTE — ED Notes (Signed)
Patient transported to ct

## 2016-03-24 ENCOUNTER — Encounter: Payer: Self-pay | Admitting: Emergency Medicine

## 2016-03-24 ENCOUNTER — Emergency Department
Admission: EM | Admit: 2016-03-24 | Discharge: 2016-03-24 | Disposition: A | Discharge status: Home / Self Care | Payer: Medicare Other | Attending: Emergency Medicine | Admitting: Emergency Medicine

## 2016-03-24 DIAGNOSIS — I10 Essential (primary) hypertension: Secondary | ICD-10-CM | POA: Insufficient documentation

## 2016-03-24 DIAGNOSIS — Z7984 Long term (current) use of oral hypoglycemic drugs: Secondary | ICD-10-CM | POA: Insufficient documentation

## 2016-03-24 DIAGNOSIS — Z7982 Long term (current) use of aspirin: Secondary | ICD-10-CM | POA: Insufficient documentation

## 2016-03-24 DIAGNOSIS — H9201 Otalgia, right ear: Secondary | ICD-10-CM | POA: Diagnosis present

## 2016-03-24 DIAGNOSIS — Z79899 Other long term (current) drug therapy: Secondary | ICD-10-CM | POA: Diagnosis not present

## 2016-03-24 DIAGNOSIS — Z87891 Personal history of nicotine dependence: Secondary | ICD-10-CM | POA: Diagnosis not present

## 2016-03-24 DIAGNOSIS — H6691 Otitis media, unspecified, right ear: Secondary | ICD-10-CM | POA: Diagnosis not present

## 2016-03-24 MED ORDER — SULFAMETHOXAZOLE-TRIMETHOPRIM 800-160 MG PO TABS
1.0000 | ORAL_TABLET | Freq: Once | ORAL | Status: AC
  Administered 2016-03-24: 1 via ORAL
  Filled 2016-03-24: qty 1

## 2016-03-24 MED ORDER — SULFAMETHOXAZOLE-TRIMETHOPRIM 800-160 MG PO TABS: 1.0000 | ORAL_TABLET | Freq: Two times a day (BID) | ORAL | 0 refills | Status: DC

## 2016-03-24 NOTE — ED Provider Notes (Signed)
[facility] Emergency Department Provider Note ____________________________________________  Time seen: Approximately 9:51 PM  I have reviewed the triage vital signs and the nursing notes.   HISTORY  Chief Complaint Otalgia    HPI Victoria Edwards is a 82 y.o. female who presents to the emergency department for evaluation of right ear pain since yesterday. She states that she has had quite a bit of sinus drainage for about the last month. She denies fever. She states that she has recently had drainage and matting in both of her eyes upon awakening in the morning and she was given some salve for infection.  Past Medical History:  Diagnosis Date  . Gout   . Hypertension   . Kidney calculus   . Kidney stones     There are no active problems to display for this patient.   Past Surgical History:  Procedure Laterality Date  . APPENDECTOMY    . CHOLECYSTECTOMY      Current Outpatient Rx  . Order #: [id number] Class: Historical Med  . Order #: [id number] Class: Historical Med  . Order #: [id number] Class: Historical Med  . Order #: [id number] Class: Historical Med  . Order #: [id number] Class: Historical Med  . Order #: [id number] Class: Historical Med  . Order #: [id number] Class: Historical Med  . Order #: [id number] Class: Historical Med  . Order #: [id number] Class: Historical Med  . Order #: [id number] Class: Historical Med  . Order #: [id number] Class: Print  . Order #: [id number] Class: Historical Med  . Order #: [id number] Class: Historical Med  . Order #: [id number] Class: Historical Med  . Order #: [id number] Class: Historical Med  . Order #: [id number] Class: Historical Med  . Order #: [id number] Class: Historical Med  . Order #: [id number] Class: Historical Med  . Order #: [id number] Class: Print    Allergies Demerol [meperidine] and Penicillins  Family History  Problem Relation Age of Onset  . Breast cancer Neg Hx     Social History Social History  Substance Use  Topics  . Smoking status: Former Games developer  . Smokeless tobacco: Never Used  . Alcohol use No    Review of Systems Constitutional: Negative for fever/chills Eyes: No visual changes. ENT: Positive for right earache. Gastrointestinal: No abdominal pain.  No nausea, no vomiting.  No diarrhea.  No constipation. Musculoskeletal: Negative for pain. Skin: Negative for rash. Neurological: Negative for headaches, focal weakness or numbness. ____________________________________________   PHYSICAL EXAM:  VITAL SIGNS: ED Triage Vitals  Enc Vitals Group     BP 03/24/16 2124 136/63     Pulse Rate 03/24/16 2124 69     Resp 03/24/16 2124 18     Temp 03/24/16 2124 98.4 F (36.9 C)     Temp Source 03/24/16 2124 Oral     SpO2 03/24/16 2124 95 %     Weight 03/24/16 2124 180 lb (81.6 kg)     Height 03/24/16 2124  (1.6 m)     Head Circumference --      Peak Flow --      Pain Score 03/24/16 2125 5     Pain Loc --      Pain Edu? --      Excl. in GC? --     Constitutional: Alert and oriented. Well appearing and in no acute distress. Eyes: Conjunctivae are normal. PERRL. EOMI. Ears: No pain with movement of either auricle:; External canal patent bilaterally;  Right TM erythematous, dull; Left TM normal   Head: Atraumatic. Nose: No congestion/rhinnorhea. Mouth/Throat: Mucous membranes are moist.  Oropharynx  non-erythematous. Hematological/Lymphatic/Immunilogical: No cervical lymphadenopathy. Cardiovascular: Normal capillary refill. Respiratory: Normal respiratory effort.  No retractions.  Neurologic:  Normal speech and language. No gross focal neurologic deficits are appreciated. Speech is normal. No gait instability. Skin:  Skin is warm, dry and intact. No rash noted. ____________________________________________   LABS (all labs ordered are listed, but only abnormal results are displayed)  Labs Reviewed - No data to  display ____________________________________________   RADIOLOGY  Not indicated ____________________________________________   PROCEDURES  Procedure(s) performed: None  ____________________________________________   INITIAL IMPRESSION / ASSESSMENT AND PLAN / ED COURSE  Pertinent labs & imaging results that were available during my care of the patient were reviewed by me and considered in my medical decision making (see chart for details).  Prescription for Bactrim will be given today.  The patient was advised to follow up with Dr. Andee Poles for symptoms that are not improving over the next 48 hours. She was advised to return to the emergency department for symptoms that change or worsen if unable to schedule an appointment.  ____________________________________________   FINAL CLINICAL IMPRESSION(S) / ED DIAGNOSES  Final diagnoses:  Acute right otitis media, recurrence not specified, unspecified otitis media type    Note:  This document was prepared using Dragon voice recognition software and may include unintentional dictation errors.     Chinita Pester, FNP 03/24/16 2209    Phineas Semen, MD 03/24/16 [phone]

## 2016-03-24 NOTE — ED Notes (Signed)
Discharge instructions reviewed with patient. Patient verbalized understanding. Patient taken to lobby via wheelchair and helped into vehicle without difficulty.  

## 2016-03-24 NOTE — ED Notes (Signed)
Pt presents to ED with c/o RIGHT earache and Right temporal area of head hurting shooting pains lasting 5-6sec since yesterday . (+) nasal drainage and congested. Pt denies running fever at home. Pt also c/o waking up with stiff neck for 2 days. Pt states on ABT ointment for eyes due to in am matted with drainage prescribed by eye MD. No acute distress noted.

## 2016-03-24 NOTE — ED Triage Notes (Signed)
Pt presents to ED with c/o RIGHT earache since yesterday. (+) nasal drainage and congested. Pt denies running fever at home. Pt also c/o waking up with stiff neck for 2 days. Pt alert and oriented x 4, no increased work in breathing noted.

## 2016-06-20 ENCOUNTER — Emergency Department
Admission: EM | Admit: 2016-06-20 | Discharge: 2016-06-21 | Disposition: A | Discharge status: Home / Self Care | Payer: Medicare Other | Attending: Student in an Organized Health Care Education/Training Program | Admitting: Student in an Organized Health Care Education/Training Program

## 2016-06-20 ENCOUNTER — Encounter: Payer: Self-pay | Admitting: Emergency Medicine

## 2016-06-20 ENCOUNTER — Emergency Department: Payer: Medicare Other

## 2016-06-20 DIAGNOSIS — I951 Orthostatic hypotension: Secondary | ICD-10-CM | POA: Insufficient documentation

## 2016-06-20 DIAGNOSIS — R410 Disorientation, unspecified: Secondary | ICD-10-CM | POA: Diagnosis not present

## 2016-06-20 DIAGNOSIS — Z7982 Long term (current) use of aspirin: Secondary | ICD-10-CM | POA: Diagnosis not present

## 2016-06-20 DIAGNOSIS — R4182 Altered mental status, unspecified: Secondary | ICD-10-CM | POA: Diagnosis present

## 2016-06-20 DIAGNOSIS — I639 Cerebral infarction, unspecified: Secondary | ICD-10-CM

## 2016-06-20 DIAGNOSIS — Z79899 Other long term (current) drug therapy: Secondary | ICD-10-CM | POA: Insufficient documentation

## 2016-06-20 LAB — COMPREHENSIVE METABOLIC PANEL
ALBUMIN: 4.1 g/dL (ref 3.5–5.0)
ALK PHOS: 58 U/L (ref 38–126)
ALT: 13 U/L — ABNORMAL LOW (ref 14–54)
AST: 22 U/L (ref 15–41)
Anion gap: 9 (ref 5–15)
BILIRUBIN TOTAL: 0.5 mg/dL (ref 0.3–1.2)
BUN: 25 mg/dL — AB (ref 6–20)
CALCIUM: 9.3 mg/dL (ref 8.9–10.3)
CO2: 25 mmol/L (ref 22–32)
Chloride: 102 mmol/L (ref 101–111)
Creatinine, Ser: 1.5 mg/dL — ABNORMAL HIGH (ref 0.44–1.00)
GFR calc Af Amer: 35 mL/min — ABNORMAL LOW (ref >60)
GFR calc non Af Amer: 31 mL/min — ABNORMAL LOW (ref >60)
GLUCOSE: 123 mg/dL — AB (ref 65–99)
Potassium: 4.3 mmol/L (ref 3.5–5.1)
Sodium: 136 mmol/L (ref 135–145)
TOTAL PROTEIN: 7.4 g/dL (ref 6.5–8.1)

## 2016-06-20 LAB — URINALYSIS COMPLETE WITH MICROSCOPIC (ARMC ONLY)
BACTERIA UA: NONE SEEN
Bilirubin Urine: NEGATIVE
GLUCOSE, UA: NEGATIVE mg/dL
HGB URINE DIPSTICK: NEGATIVE
Ketones, ur: NEGATIVE mg/dL
Nitrite: NEGATIVE
Protein, ur: NEGATIVE mg/dL
Specific Gravity, Urine: 1.013 (ref 1.005–1.030)
pH: 6 (ref 5.0–8.0)

## 2016-06-20 LAB — CBC WITH DIFFERENTIAL/PLATELET
BASOS ABS: 0 10*3/uL (ref 0–0.1)
BASOS PCT: 1 %
Eosinophils Absolute: 0.2 10*3/uL (ref 0–0.7)
Eosinophils Relative: 3 %
HEMATOCRIT: 38.7 % (ref 35.0–47.0)
HEMOGLOBIN: 12.8 g/dL (ref 12.0–16.0)
Lymphocytes Relative: 33 %
Lymphs Abs: 2.5 10*3/uL (ref 1.0–3.6)
MCH: 30.6 pg (ref 26.0–34.0)
MCHC: 33.1 g/dL (ref 32.0–36.0)
MCV: 92.3 fL (ref 80.0–100.0)
Monocytes Absolute: 0.5 10*3/uL (ref 0.2–0.9)
Monocytes Relative: 7 %
NEUTROS ABS: 4.2 10*3/uL (ref 1.4–6.5)
NEUTROS PCT: 56 %
Platelets: 195 10*3/uL (ref 150–440)
RBC: 4.19 MIL/uL (ref 3.80–5.20)
RDW: 14.5 % (ref 11.5–14.5)
WBC: 7.5 10*3/uL (ref 3.6–11.0)

## 2016-06-20 LAB — GLUCOSE, CAPILLARY: GLUCOSE-CAPILLARY: 125 mg/dL — AB (ref 65–99)

## 2016-06-20 LAB — TROPONIN I: Troponin I: <0.03 ng/mL (ref <0.03)

## 2016-06-20 MED ORDER — ONDANSETRON 4 MG PO TBDP
ORAL_TABLET | ORAL | Status: AC
  Administered 2016-06-20: 4 mg via ORAL
  Filled 2016-06-20: qty 1

## 2016-06-20 MED ORDER — ONDANSETRON 4 MG PO TBDP
4.0000 mg | ORAL_TABLET | Freq: Once | ORAL | Status: AC
  Administered 2016-06-20: 4 mg via ORAL

## 2016-06-20 MED ORDER — ACETAMINOPHEN 500 MG PO TABS
1000.0000 mg | ORAL_TABLET | Freq: Once | ORAL | Status: AC
  Administered 2016-06-20: 1000 mg via ORAL
  Filled 2016-06-20: qty 2

## 2016-06-20 NOTE — ED Provider Notes (Signed)
[facility] Emergency Department Provider Note    First MD Initiated Contact with Patient 06/20/16 2112     (approximate)  I have reviewed the triage vital signs and the nursing notes.   HISTORY  Chief Complaint Altered Mental Status    HPI Victoria Edwards is a 82 y.o. female who presents with brief episode of confusion and disorientation while the patient was going to bed. Patient states that she adamantly remembers reading her Bible and while moving to kneel to Victoria Edwards bedside should be getting confused and disoriented feeling that the TV was far off and she did not recognize her room. States this lasted roughly 15 seconds and then she became scared. There is no associated numbness or tingling. No headaches. No shortness of breath or chest pain or abdominal pain.  She denies any previous history of strokes. No history of dysrhythmia. She does take a daily aspirin. She currently feels well except for a mild headache behind her left eye. Denies any visual disturbances. She otherwise has no complaints.   Past Medical History:  Diagnosis Date  . Gout   . Hypertension   . Kidney calculus   . Kidney stones     There are no active problems to display for this patient.   Past Surgical History:  Procedure Laterality Date  . APPENDECTOMY    . CHOLECYSTECTOMY      Prior to Admission medications   Medication Sig Start Date End Date Taking? Authorizing Provider  allopurinol (ZYLOPRIM) 300 MG tablet Take 300 mg by mouth daily.    Historical Provider, MD  aspirin EC 81 MG tablet Take 81 mg by mouth daily.    Historical Provider, MD  Cholecalciferol (VITAMIN D3) 2000 UNITS TABS Take 2,000 Units by mouth at bedtime.    Historical Provider, MD  Coconut Oil (EQL COCONUT OIL) 1000 MG CAPS Take 1,000 mg by mouth daily.    Historical Provider, MD  diazepam (VALIUM) 5 MG tablet Take 2.5 mg by mouth at bedtime as needed (for sleep).    Historical Provider, MD  glipiZIDE  (GLUCOTROL XL) 5 MG 24 hr tablet Take 5 mg by mouth daily.    Historical Provider, MD  ipratropium (ATROVENT) 0.03 % nasal spray Place 2 sprays into both nostrils 2 (two) times daily.    Historical Provider, MD  ketoconazole (NIZORAL) 2 % shampoo Apply 1 application topically every 14 (fourteen) days.    Historical Provider, MD  levothyroxine (SYNTHROID, LEVOTHROID) 25 MCG tablet Take 25 mcg by mouth daily before breakfast.    Historical Provider, MD  lisinopril (PRINIVIL,ZESTRIL) 20 MG tablet Take 20 mg by mouth daily.    Historical Provider, MD  meclizine (ANTIVERT) 25 MG tablet Take 1 tablet (25 mg total) by mouth 3 (three) times daily as needed for dizziness. 07/15/15   Myrna Blazeravid Matthew Schaevitz, MD  metoprolol (LOPRESSOR) 50 MG tablet Take 50 mg by mouth 2 (two) times daily.    Historical Provider, MD  mometasone (ELOCON) 0.1 % lotion Apply 1 application topically daily as needed (for itching on ears).    Historical Provider, MD  Multiple Vitamin (MULTIVITAMIN WITH MINERALS) TABS tablet Take 1 tablet by mouth daily.    Historical Provider, MD  Omega-3 Fatty Acids (FISH OIL) 1000 MG CAPS Take 1,000 mg by mouth 3 (three) times daily.    Historical Provider, MD  omeprazole (PRILOSEC) 20 MG capsule Take 20 mg by mouth 2 (two) times daily.    Historical Provider, MD  Red Yeast Rice 600 MG CAPS Take 600 mg by mouth daily.    Historical Provider, MD  sertraline (ZOLOFT) 25 MG tablet Take 25 mg by mouth daily.    Historical Provider, MD  sulfamethoxazole-trimethoprim (BACTRIM DS,SEPTRA DS) 800-160 MG tablet Take 1 tablet by mouth 2 (two) times daily. 03/24/16   Chinita Pester, FNP    Allergies Demerol [meperidine]; Shellfish allergy; and Penicillins  Family History  Problem Relation Age of Onset  . Breast cancer Neg Hx     Social History Social History  Substance Use Topics  . Smoking status: Never Smoker  . Smokeless tobacco: Never Used  . Alcohol use No    Review of Systems Patient  denies headaches, rhinorrhea, blurry vision, numbness, shortness of breath, chest pain, edema, cough, abdominal pain, nausea, vomiting, diarrhea, dysuria, fevers, rashes or hallucinations unless otherwise stated above in HPI. ____________________________________________   PHYSICAL EXAM:  VITAL SIGNS: Vitals:   06/20/16 1951  BP: (!) 178/74  Pulse: 68  Resp: 18  Temp: 98.1 F (36.7 C)    Constitutional: Alert and oriented. Well appearing and in no acute distress. Eyes: Conjunctivae are normal. PERRL. EOMI. Head: Atraumatic. Nose: No congestion/rhinnorhea. Mouth/Throat: Mucous membranes are moist.  Oropharynx non-erythematous. Neck: No stridor. Painless ROM. No cervical spine tenderness to palpation Hematological/Lymphatic/Immunilogical: No cervical lymphadenopathy. Cardiovascular: Normal rate, regular rhythm. Grossly normal heart sounds.  Good peripheral circulation. Respiratory: Normal respiratory effort.  No retractions. Lungs CTAB. Gastrointestinal: Soft and nontender. No distention. No abdominal bruits. No CVA tenderness. Genitourinary:  Musculoskeletal: No lower extremity tenderness nor edema.  No joint effusions. Neurologic:  CN- intact.  No facial droop, Normal FNF.  Normal heel to shin.  Sensation intact bilaterally. Normal speech and language. No gross focal neurologic deficits are appreciated. No gait instability.  Skin:  Skin is warm, dry and intact. No rash noted. Psychiatric: Mood and affect are normal. Speech and behavior are normal.  ____________________________________________   LABS (all labs ordered are listed, but only abnormal results are displayed)  Results for orders placed or performed during the hospital encounter of 06/20/16 (from the past 24 hour(s))  Glucose, capillary     Status: Abnormal   Collection Time: 06/20/16  8:00 PM  Result Value Ref Range   Glucose-Capillary 125 (H) 65 - 99 mg/dL  CBC with Differential     Status: None   Collection  Time: 06/20/16  8:08 PM  Result Value Ref Range   WBC 7.5 3.6 - 11.0 K/uL   RBC 4.19 3.80 - 5.20 MIL/uL   Hemoglobin 12.8 12.0 - 16.0 g/dL   HCT 91.4 78.2 - 95.6 %   MCV 92.3 80.0 - 100.0 fL   MCH 30.6 26.0 - 34.0 pg   MCHC 33.1 32.0 - 36.0 g/dL   RDW 21.3 08.6 - 57.8 %   Platelets 195 150 - 440 K/uL   Neutrophils Relative % 56 %   Neutro Abs 4.2 1.4 - 6.5 K/uL   Lymphocytes Relative 33 %   Lymphs Abs 2.5 1.0 - 3.6 K/uL   Monocytes Relative 7 %   Monocytes Absolute 0.5 0.2 - 0.9 K/uL   Eosinophils Relative 3 %   Eosinophils Absolute 0.2 0 - 0.7 K/uL   Basophils Relative 1 %   Basophils Absolute 0.0 0 - 0.1 K/uL  Comprehensive metabolic panel     Status: Abnormal   Collection Time: 06/20/16  8:08 PM  Result Value Ref Range   Sodium 136 135 - 145 mmol/L  Potassium 4.3 3.5 - 5.1 mmol/L   Chloride 102 101 - 111 mmol/L   CO2 25 22 - 32 mmol/L   Glucose, Bld 123 (H) 65 - 99 mg/dL   BUN 25 (H) 6 - 20 mg/dL   Creatinine, Ser 8.29 (H) 0.44 - 1.00 mg/dL   Calcium 9.3 8.9 - 56.2 mg/dL   Total Protein 7.4 6.5 - 8.1 g/dL   Albumin 4.1 3.5 - 5.0 g/dL   AST 22 15 - 41 U/L   ALT 13 (L) 14 - 54 U/L   Alkaline Phosphatase 58 38 - 126 U/L   Total Bilirubin 0.5 0.3 - 1.2 mg/dL   GFR calc non Af Amer 31 (L) >60 mL/min   GFR calc Af Amer 35 (L) >60 mL/min   Anion gap 9 5 - 15  Troponin I     Status: None   Collection Time: 06/20/16  8:08 PM  Result Value Ref Range   Troponin I <0.03 <0.03 ng/mL   ____________________________________________  EKG My review and personal interpretation at Time: 20:23   Indication: confusion  Rate: 70  Rhythm: nsr Axis: normal Other: nonspecific twave inversions in precordial leads that are consistent with previous 11/04/15 ____________________________________________  RADIOLOGY  I personally reviewed all radiographic images ordered to evaluate for the above acute complaints and reviewed radiology reports and findings.  These findings were personally  discussed with the patient.  Please see medical record for radiology report. ____________________________________________   PROCEDURES  Procedure(s) performed: none    Critical Care performed: no ____________________________________________   INITIAL IMPRESSION / ASSESSMENT AND PLAN / ED COURSE  Pertinent labs & imaging results that were available during my care of the patient were reviewed by me and considered in my medical decision making (see chart for details).  DDX: ams, tia, cva, electrolyte abd, dysrythmia  Victoria Edwards is a 82 y.o. who presents to the ED with brief episode of confusion. Her neuro exam is nonfocal and she has no deficits at this time. CT imaging without any evidence of acute ischemia or bleed. Electrolyte laboratory evaluation is reassuring. EKG shows no acute changes. Her history is fairly peculiar and will be very atypical for TIA but based on the increased risk factors given her age and vague history do feel that MRI MRA is clinically indicated to evaluate for any acute ischemia. Patient will be signed out to Dr. Manson Passey pending evaluation of MRI. If MRI is unremarkable patient is currently stable for outpatient follow-up.  Have discussed with the patient and available family all diagnostics and treatments performed thus far and all questions were answered to the best of my ability. The patient demonstrates understanding and agreement with plan.   Clinical Course     ____________________________________________   FINAL CLINICAL IMPRESSION(S) / ED DIAGNOSES  Final diagnoses:  Confusion      NEW MEDICATIONS STARTED DURING THIS VISIT:  New Prescriptions   No medications on file     Note:  This document was prepared using Dragon voice recognition software and may include unintentional dictation errors.    Willy Eddy, MD 06/21/16 [phone]

## 2016-06-20 NOTE — ED Notes (Signed)
Pt reports she had a period of time this even where she "did not feel right". States she felt "off balance" but denied dizziness. Pt does report a hx of vertigo.

## 2016-06-20 NOTE — ED Triage Notes (Signed)
Pt presents to ED via POV. Pt reports she was about to go to bed when she looked up and didn't know where she was. Pt states has since then resolved. Pt is alert and oriented x 4. Pt now c/o HA behind L eye. Pt states episode lasted approx 15 seconds at approx 530-6. Facial symmetry intact, grip strengths equal bilaterally, leg strengths equal bilaterally. Pt states she took 1/2 tablet of valium at home.

## 2016-06-21 ENCOUNTER — Emergency Department: Payer: Medicare Other

## 2016-06-21 MED ORDER — CEPHALEXIN 500 MG PO CAPS: 500.0000 mg | ORAL_CAPSULE | Freq: Two times a day (BID) | ORAL | 0 refills | Status: AC

## 2016-06-21 MED ORDER — LORAZEPAM 2 MG/ML IJ SOLN
1.0000 mg | Freq: Once | INTRAMUSCULAR | Status: AC
  Administered 2016-06-21: 1 mg via INTRAVENOUS
  Filled 2016-06-21: qty 1

## 2016-06-21 NOTE — ED Notes (Signed)
Pt returned from MRI °

## 2016-06-21 NOTE — ED Provider Notes (Signed)
I assumed care of the patient at 12:00 AM from Dr. Roxan Hockeyobinson MRI of the brain revealed no gross abnormality. Patient informed me that the reef episode of blurred vision occurred while changing positions lasting seconds. I suspect this is secondary to orthostatic hypotension given normal MRI.   Darci Currentandolph N Brown, MD 06/21/16 [phone]

## 2016-06-21 NOTE — ED Notes (Signed)
Reviewed d/c instructions, follow-up care with pt. Pt verbalized understanding 

## 2016-06-21 NOTE — ED Notes (Signed)
Discussed dx of UTI with pt. Pt reported hx of multiple UTIs, and that she was currently having frequent urination. Reviewed pt's d/c instructions, follow-up care. Pt verbalized understanding

## 2016-06-21 NOTE — ED Notes (Signed)
Patient transported to MRI 

## 2016-06-22 LAB — URINE CULTURE: Culture: NO GROWTH

## 2016-08-27 ENCOUNTER — Emergency Department
Admission: EM | Admit: 2016-08-27 | Discharge: 2016-08-27 | Disposition: A | Discharge status: Home / Self Care | Payer: Medicare Other | Attending: Emergency Medicine | Admitting: Emergency Medicine

## 2016-08-27 ENCOUNTER — Emergency Department: Payer: Medicare Other

## 2016-08-27 ENCOUNTER — Encounter: Payer: Self-pay | Admitting: *Deleted

## 2016-08-27 DIAGNOSIS — R1010 Upper abdominal pain, unspecified: Secondary | ICD-10-CM

## 2016-08-27 DIAGNOSIS — I10 Essential (primary) hypertension: Secondary | ICD-10-CM | POA: Insufficient documentation

## 2016-08-27 DIAGNOSIS — Z79899 Other long term (current) drug therapy: Secondary | ICD-10-CM | POA: Diagnosis not present

## 2016-08-27 DIAGNOSIS — Z7982 Long term (current) use of aspirin: Secondary | ICD-10-CM | POA: Insufficient documentation

## 2016-08-27 DIAGNOSIS — Z7984 Long term (current) use of oral hypoglycemic drugs: Secondary | ICD-10-CM | POA: Diagnosis not present

## 2016-08-27 LAB — COMPREHENSIVE METABOLIC PANEL
ALBUMIN: 3.8 g/dL (ref 3.5–5.0)
ALK PHOS: 61 U/L (ref 38–126)
ALT: 10 U/L — ABNORMAL LOW (ref 14–54)
ANION GAP: 10 (ref 5–15)
AST: 33 U/L (ref 15–41)
BILIRUBIN TOTAL: 0.3 mg/dL (ref 0.3–1.2)
BUN: 22 mg/dL — AB (ref 6–20)
CALCIUM: 8.8 mg/dL — AB (ref 8.9–10.3)
CO2: 24 mmol/L (ref 22–32)
Chloride: 101 mmol/L (ref 101–111)
Creatinine, Ser: 1.33 mg/dL — ABNORMAL HIGH (ref 0.44–1.00)
GFR calc Af Amer: 41 mL/min — ABNORMAL LOW (ref >60)
GFR, EST NON AFRICAN AMERICAN: 35 mL/min — AB (ref >60)
GLUCOSE: 159 mg/dL — AB (ref 65–99)
Potassium: 4.4 mmol/L (ref 3.5–5.1)
Sodium: 135 mmol/L (ref 135–145)
TOTAL PROTEIN: 6.9 g/dL (ref 6.5–8.1)

## 2016-08-27 LAB — CBC
HEMATOCRIT: 37.1 % (ref 35.0–47.0)
HEMOGLOBIN: 12.6 g/dL (ref 12.0–16.0)
MCH: 30.2 pg (ref 26.0–34.0)
MCHC: 33.9 g/dL (ref 32.0–36.0)
MCV: 89.1 fL (ref 80.0–100.0)
Platelets: 178 10*3/uL (ref 150–440)
RBC: 4.16 MIL/uL (ref 3.80–5.20)
RDW: 14 % (ref 11.5–14.5)
WBC: 6.2 10*3/uL (ref 3.6–11.0)

## 2016-08-27 LAB — URINALYSIS, ROUTINE W REFLEX MICROSCOPIC
Bilirubin Urine: NEGATIVE
Glucose, UA: NEGATIVE mg/dL
Hgb urine dipstick: NEGATIVE
Ketones, ur: NEGATIVE mg/dL
Nitrite: NEGATIVE
Protein, ur: NEGATIVE mg/dL
SPECIFIC GRAVITY, URINE: 1.013 (ref 1.005–1.030)
pH: 6 (ref 5.0–8.0)

## 2016-08-27 LAB — LIPASE, BLOOD: LIPASE: 32 U/L (ref 11–51)

## 2016-08-27 LAB — TROPONIN I

## 2016-08-27 MED ORDER — FOSFOMYCIN TROMETHAMINE 3 G PO PACK
3.0000 g | PACK | Freq: Once | ORAL | Status: AC
  Administered 2016-08-27: 3 g via ORAL
  Filled 2016-08-27: qty 3

## 2016-08-27 NOTE — ED Triage Notes (Signed)
Pt arrived to ED from home reporting sudden onset of upper abd pain beginning at 1400 today. Pt verbalized pain was sharp and started on the eft side and radiated to the right side. Pt no longer in pain. Pt denies NVD and SOB. Pt alert and oriented x 4.

## 2016-08-27 NOTE — ED Notes (Signed)
Patient transported to X-ray 

## 2016-08-27 NOTE — ED Provider Notes (Signed)
[facility] Emergency Department Provider Note  Time seen: 4:10 PM  I have reviewed the triage vital signs and the nursing notes.   HISTORY  Chief Complaint Abdominal Pain    HPI Victoria Edwards is a 82 y.o. female with a past medical history of hypertension who presents to the emergency department for upper abdominal pain. According to the patient approximately one hour after lunch she developed a sharp pain across the upper abdomen. States it lasted one or 2 seconds, several minutes later she developed another pain again lasting 1 second. Patient called EMS for evaluation. Once EMS arrived the patient had an additional pain which also lasted approximately one second, sharp across the upper abdomen. Denies any nausea or vomiting at any time. Denies any diarrhea, chest pain or shortness of breath. Currently the patient is symptom-free, states she has not had a pain for over 30 minutes now. Denies any history of similar pain in the past. She states she feels normal currently but thought she should get checked out due to her age.  Past Medical History:  Diagnosis Date  . Gout   . Hypertension   . Kidney calculus   . Kidney stones     There are no active problems to display for this patient.   Past Surgical History:  Procedure Laterality Date  . APPENDECTOMY    . CHOLECYSTECTOMY    . TONSILLECTOMY      Prior to Admission medications   Medication Sig Start Date End Date Taking? Authorizing Provider  allopurinol (ZYLOPRIM) 300 MG tablet Take 300 mg by mouth daily.    Historical Provider, MD  aspirin EC 81 MG tablet Take 81 mg by mouth daily.    Historical Provider, MD  Cholecalciferol (VITAMIN D3) 2000 UNITS TABS Take 2,000 Units by mouth at bedtime.    Historical Provider, MD  Coconut Oil (EQL COCONUT OIL) 1000 MG CAPS Take 1,000 mg by mouth daily.    Historical Provider, MD  diazepam (VALIUM) 5 MG tablet Take 2.5 mg by mouth at bedtime as needed (for sleep).     Historical Provider, MD  glipiZIDE (GLUCOTROL XL) 5 MG 24 hr tablet Take 5 mg by mouth daily.    Historical Provider, MD  ipratropium (ATROVENT) 0.03 % nasal spray Place 2 sprays into both nostrils 2 (two) times daily.    Historical Provider, MD  ketoconazole (NIZORAL) 2 % shampoo Apply 1 application topically every 14 (fourteen) days.    Historical Provider, MD  levothyroxine (SYNTHROID, LEVOTHROID) 25 MCG tablet Take 25 mcg by mouth daily before breakfast.    Historical Provider, MD  lisinopril (PRINIVIL,ZESTRIL) 20 MG tablet Take 20 mg by mouth daily.    Historical Provider, MD  meclizine (ANTIVERT) 25 MG tablet Take 1 tablet (25 mg total) by mouth 3 (three) times daily as needed for dizziness. 07/15/15   Myrna Blazeravid Matthew Schaevitz, MD  metoprolol (LOPRESSOR) 50 MG tablet Take 50 mg by mouth 2 (two) times daily.    Historical Provider, MD  mometasone (ELOCON) 0.1 % lotion Apply 1 application topically daily as needed (for itching on ears).    Historical Provider, MD  Multiple Vitamin (MULTIVITAMIN WITH MINERALS) TABS tablet Take 1 tablet by mouth daily.    Historical Provider, MD  Omega-3 Fatty Acids (FISH OIL) 1000 MG CAPS Take 1,000 mg by mouth 3 (three) times daily.    Historical Provider, MD  omeprazole (PRILOSEC) 20 MG capsule Take 20 mg by mouth 2 (two) times daily.  Historical Provider, MD  Red Yeast Rice 600 MG CAPS Take 600 mg by mouth daily.    Historical Provider, MD  sertraline (ZOLOFT) 25 MG tablet Take 25 mg by mouth daily.    Historical Provider, MD  sulfamethoxazole-trimethoprim (BACTRIM DS,SEPTRA DS) 800-160 MG tablet Take 1 tablet by mouth 2 (two) times daily. 03/24/16   Chinita Pesterari B Triplett, FNP    Allergies  Allergen Reactions  . Demerol [Meperidine] Other (See Comments)    Reaction:  Confusion   . Shellfish Allergy   . Penicillins Itching, Rash and Other (See Comments)    Has patient had a PCN reaction causing immediate rash, facial/tongue/throat swelling, SOB or  lightheadedness with hypotension: No Has patient had a PCN reaction causing severe rash involving mucus membranes or skin necrosis: No Has patient had a PCN reaction that required hospitalization No Has patient had a PCN reaction occurring within the last 10 years: No If all of the above answers are "NO", then may proceed with Cephalosporin use.    Family History  Problem Relation Age of Onset  . Breast cancer Neg Hx     Social History Social History  Substance Use Topics  . Smoking status: Never Smoker  . Smokeless tobacco: Never Used  . Alcohol use No    Review of Systems Constitutional: Negative for fever. Cardiovascular: Negative for chest pain. Respiratory: Negative for shortness of breath. Gastrointestinal: Sharp upper abdominal pain, gone currently. Denies nausea, vomiting, diarrhea. Genitourinary: Negative for dysuria. Neurological: Negative for headache 10-point ROS otherwise negative.  ____________________________________________   PHYSICAL EXAM:  Constitutional: Alert and oriented. Well appearing and in no distress. Eyes: Normal exam ENT   Head: Normocephalic and atraumatic   Mouth/Throat: Mucous membranes are moist. Cardiovascular: Normal rate, regular rhythm. Respiratory: Normal respiratory effort without tachypnea nor retractions. Breath sounds are clear  Gastrointestinal: Soft and nontender. No distention.  Musculoskeletal: Nontender with normal range of motion in all extremities.  Neurologic:  Normal speech and language. No gross focal neurologic deficits Skin:  Skin is warm, dry and intact.  Psychiatric: Mood and affect are normal.   ____________________________________________    EKG  EKG reviewed and interpreted by myself shows normal sinus rhythm at 62 bpm, narrow QRS, normal axis, normal intervals, nonspecific ST changes.  ____________________________________________    RADIOLOGY  X-ray  negative  ____________________________________________   INITIAL IMPRESSION / ASSESSMENT AND PLAN / ED COURSE  Pertinent labs & imaging results that were available during my care of the patient were reviewed by me and considered in my medical decision making (see chart for details).  Overall very well-appearing patient, presents for abdominal pain which she describes as sharp lasting 1 second and then dissipating. Denies any discomfort currently. We will check labs including abdominal and cardiac labs, chest x-ray, and closely monitor.  Patient workup is largely within normal limits, labs are at her baseline, patient does have moderate leukocytes on her urinalysis but no white blood cells seen. Given moderate leukocytes with rare bacteria we will cover with a one-time dose of fosfomycin. I have added a urine culture. I do not suspect this is related to her upper abdominal pain however. Patient has remained pain-free throughout her stay in the emergency department. Her labs and x-ray are within normal limits/baseline. We will discharge home with PCP follow-up. I discussed my normal abdominal pain return precautions.  ____________________________________________   FINAL CLINICAL IMPRESSION(S) / ED DIAGNOSES  Upper abdominal pain    Minna AntisKevin Rhonda Linan, MD 08/27/16 1746

## 2016-08-27 NOTE — Discharge Instructions (Signed)
Please follow-up with her primary care doctor says possible regarding today's emergency department visit for abdominal pain. Return to the emergency department for any worsening abdominal pain, or any other symptom personally concerning to yourself.

## 2016-08-29 LAB — URINE CULTURE

## 2016-09-30 ENCOUNTER — Other Ambulatory Visit: Payer: Self-pay | Admitting: Internal Medicine

## 2016-09-30 DIAGNOSIS — Z1231 Encounter for screening mammogram for malignant neoplasm of breast: Secondary | ICD-10-CM

## 2016-10-29 ENCOUNTER — Ambulatory Visit: Payer: Medicare Other

## 2016-11-04 ENCOUNTER — Ambulatory Visit
Admission: RE | Admit: 2016-11-04 | Discharge: 2016-11-04 | Disposition: A | Discharge status: Home / Self Care | Payer: Medicare Other | Source: Ambulatory Visit | Attending: Internal Medicine | Admitting: Internal Medicine

## 2016-11-04 DIAGNOSIS — Z1231 Encounter for screening mammogram for malignant neoplasm of breast: Secondary | ICD-10-CM

## 2017-12-13 ENCOUNTER — Other Ambulatory Visit: Payer: Self-pay | Admitting: Internal Medicine

## 2017-12-13 DIAGNOSIS — Z1231 Encounter for screening mammogram for malignant neoplasm of breast: Secondary | ICD-10-CM

## 2017-12-30 ENCOUNTER — Ambulatory Visit
Admission: RE | Admit: 2017-12-30 | Discharge: 2017-12-30 | Disposition: A | Discharge status: Home / Self Care | Payer: Medicare Other | Source: Ambulatory Visit | Attending: Internal Medicine | Admitting: Internal Medicine

## 2017-12-30 DIAGNOSIS — Z1231 Encounter for screening mammogram for malignant neoplasm of breast: Secondary | ICD-10-CM | POA: Insufficient documentation

## 2018-01-02 ENCOUNTER — Other Ambulatory Visit: Payer: Self-pay | Admitting: Internal Medicine

## 2018-01-02 DIAGNOSIS — N6489 Other specified disorders of breast: Secondary | ICD-10-CM

## 2018-01-02 DIAGNOSIS — R928 Other abnormal and inconclusive findings on diagnostic imaging of breast: Secondary | ICD-10-CM

## 2018-01-12 ENCOUNTER — Ambulatory Visit
Admission: RE | Admit: 2018-01-12 | Discharge: 2018-01-12 | Disposition: A | Discharge status: Home / Self Care | Payer: Medicare Other | Source: Ambulatory Visit | Attending: Internal Medicine | Admitting: Internal Medicine

## 2018-01-12 DIAGNOSIS — N6489 Other specified disorders of breast: Secondary | ICD-10-CM | POA: Diagnosis present

## 2018-01-12 DIAGNOSIS — R928 Other abnormal and inconclusive findings on diagnostic imaging of breast: Secondary | ICD-10-CM | POA: Insufficient documentation

## 2018-07-28 ENCOUNTER — Other Ambulatory Visit: Payer: Self-pay

## 2018-07-28 ENCOUNTER — Encounter: Payer: Self-pay | Admitting: Emergency Medicine

## 2018-07-28 DIAGNOSIS — M546 Pain in thoracic spine: Secondary | ICD-10-CM | POA: Insufficient documentation

## 2018-07-28 DIAGNOSIS — Z7984 Long term (current) use of oral hypoglycemic drugs: Secondary | ICD-10-CM | POA: Insufficient documentation

## 2018-07-28 DIAGNOSIS — Z79899 Other long term (current) drug therapy: Secondary | ICD-10-CM | POA: Insufficient documentation

## 2018-07-28 DIAGNOSIS — Z7982 Long term (current) use of aspirin: Secondary | ICD-10-CM | POA: Insufficient documentation

## 2018-07-28 DIAGNOSIS — I10 Essential (primary) hypertension: Secondary | ICD-10-CM | POA: Insufficient documentation

## 2018-07-28 NOTE — ED Triage Notes (Signed)
Pt arrives via [facility] with back pain that has been going on for several days. Per EMS, [facility]. Pt reports that she noticed pain after she took the trash down to the road last week. Granddaughter reports that pt often has UTI. Pt giving urine sample at this time. Pt reports fall 2-3 weeks ago. Pt is in NAD.

## 2018-07-29 ENCOUNTER — Emergency Department
Admission: EM | Admit: 2018-07-29 | Discharge: 2018-07-29 | Disposition: A | Discharge status: Home / Self Care | Payer: Medicare Other | Attending: Emergency Medicine | Admitting: Emergency Medicine

## 2018-07-29 ENCOUNTER — Emergency Department: Payer: Medicare Other

## 2018-07-29 DIAGNOSIS — M546 Pain in thoracic spine: Secondary | ICD-10-CM

## 2018-07-29 LAB — URINALYSIS, COMPLETE (UACMP) WITH MICROSCOPIC
Bilirubin Urine: NEGATIVE
Glucose, UA: NEGATIVE mg/dL
HGB URINE DIPSTICK: NEGATIVE
KETONES UR: NEGATIVE mg/dL
NITRITE: NEGATIVE
PROTEIN: 30 mg/dL — AB
Specific Gravity, Urine: 1.013 (ref 1.005–1.030)
pH: 5 (ref 5.0–8.0)

## 2018-07-29 MED ORDER — LIDOCAINE 5 % EX PTCH
2.0000 | MEDICATED_PATCH | CUTANEOUS | Status: DC
  Administered 2018-07-29: 2 via TRANSDERMAL
  Filled 2018-07-29: qty 2

## 2018-07-29 MED ORDER — TRAMADOL HCL 50 MG PO TABS: 50.0000 mg | ORAL_TABLET | Freq: Four times a day (QID) | ORAL | 0 refills | Status: AC | PRN

## 2018-07-29 MED ORDER — TRAMADOL HCL 50 MG PO TABS
100.0000 mg | ORAL_TABLET | Freq: Once | ORAL | Status: AC
  Administered 2018-07-29: 100 mg via ORAL
  Filled 2018-07-29: qty 2

## 2018-07-29 MED ORDER — LIDOCAINE 5 % EX PTCH: 1.0000 | MEDICATED_PATCH | CUTANEOUS | 0 refills | Status: DC

## 2018-07-29 MED ORDER — FOSFOMYCIN TROMETHAMINE 3 G PO PACK
3.0000 g | PACK | Freq: Once | ORAL | Status: AC
  Administered 2018-07-29: 3 g via ORAL
  Filled 2018-07-29: qty 3

## 2018-07-29 NOTE — ED Notes (Signed)
Called pharmacy about lido patch.

## 2018-07-29 NOTE — ED Notes (Signed)
Assessment: pt states she had lower back pain"days ago". Pt states "it's now settled in my spine". Pt complains of pain to upper back extending up to base of skull. Pt denies other symptoms.

## 2018-07-29 NOTE — ED Notes (Signed)
Report to brittany, rn

## 2018-07-30 LAB — URINE CULTURE: Culture: NO GROWTH

## 2018-07-31 NOTE — ED Provider Notes (Signed)
[facility] Emergency Department Provider Note _________   First MD Initiated Contact with Patient 07/29/18 0300     (approximate)  I have reviewed the triage vital signs and the nursing notes.   HISTORY  Chief Complaint Back Pain   HPI Victoria Edwards is a 84 y.o. female with bolus of chronic medical conditions presents to the emergency department with upper back pain 3 days. Patient states that symptoms began after she "took her trash down the road last week. Patient also admits to a fall approximately 2 weeks ago. Patient denies any chest pain no shortness of breath. Patient describes the pain is aching worse with movement. She denies any fever. Patient denies any urinary symptoms.   Past Medical History:  Diagnosis Date  . Gout   . Hypertension   . Kidney calculus   . Kidney stones     There are no active problems to display for this patient.   Past Surgical History:  Procedure Laterality Date  . APPENDECTOMY    . CHOLECYSTECTOMY    . TONSILLECTOMY      Prior to Admission medications   Medication Sig Start Date End Date Taking? Authorizing Provider  allopurinol (ZYLOPRIM) 300 MG tablet Take 300 mg by mouth daily.    [provider]  aspirin EC 81 MG tablet Take 81 mg by mouth daily.    [provider]  Cholecalciferol (VITAMIN D3) 2000 UNITS TABS Take 2,000 Units by mouth at bedtime.    [provider]  Coconut Oil (EQL COCONUT OIL) 1000 MG CAPS Take 1,000 mg by mouth daily.    [provider]  diazepam (VALIUM) 5 MG tablet Take 2.5 mg by mouth at bedtime as needed (for sleep).    [provider]  glipiZIDE (GLUCOTROL XL) 5 MG 24 hr tablet Take 5 mg by mouth daily.    [provider]  ipratropium (ATROVENT) 0.03 % nasal spray Place 2 sprays into both nostrils 2 (two) times daily.    [provider]  ketoconazole (NIZORAL) 2 % shampoo Apply 1 application topically every 14  (fourteen) days.    [provider]  levothyroxine (SYNTHROID, LEVOTHROID) 25 MCG tablet Take 25 mcg by mouth daily before breakfast.    [provider]  lidocaine (LIDODERM) 5 % Place 1 patch onto the skin daily. Remove & Discard patch within 12 hours or as directed by MD 07/29/18   Darci Current, MD  lisinopril (PRINIVIL,ZESTRIL) 20 MG tablet Take 20 mg by mouth daily.    [provider]  meclizine (ANTIVERT) 25 MG tablet Take 1 tablet (25 mg total) by mouth 3 (three) times daily as needed for dizziness. 07/15/15   Myrna Blazer, MD  metoprolol (LOPRESSOR) 50 MG tablet Take 50 mg by mouth 2 (two) times daily.    [provider]  mometasone (ELOCON) 0.1 % lotion Apply 1 application topically daily as needed (for itching on ears).    [provider]  Multiple Vitamin (MULTIVITAMIN WITH MINERALS) TABS tablet Take 1 tablet by mouth daily.    [provider]  Omega-3 Fatty Acids (FISH OIL) 1000 MG CAPS Take 1,000 mg by mouth 3 (three) times daily.    [provider]  omeprazole (PRILOSEC) 20 MG capsule Take 20 mg by mouth 2 (two) times daily.    [provider]  Red Yeast Rice 600 MG CAPS Take 600 mg by mouth daily.    [provider]  sertraline (  ZOLOFT) 25 MG tablet Take 25 mg by mouth daily.    [provider]  sulfamethoxazole-trimethoprim (BACTRIM DS,SEPTRA DS) 800-160 MG tablet Take 1 tablet by mouth 2 (two) times daily. 03/24/16   Triplett, Rulon Eisenmengerari B, FNP  traMADol (ULTRAM) 50 MG tablet Take 1 tablet (50 mg total) by mouth every 6 (six) hours as needed. 07/29/18 07/29/19  Darci CurrentBrown, [place] N, MD    Allergies Demerol [meperidine]; Shellfish allergy; Statins; and Penicillins  Family History  Problem Relation Age of Onset  . Breast cancer Neg Hx     Social History Social History   Tobacco Use  . Smoking status: Never Smoker  . Smokeless tobacco: Never Used  Substance Use Topics  .  Alcohol use: No  . Drug use: No    Review of Systems Constitutional: No fever/chills Eyes: No visual changes. ENT: No sore throat. Cardiovascular: Denies chest pain. Respiratory: Denies shortness of breath. Gastrointestinal: No abdominal pain.  No nausea, no vomiting.  No diarrhea.  No constipation. Genitourinary: Negative for dysuria. Musculoskeletal: Negative for neck pain.  positive for back pain. Integumentary: Negative for rash. Neurological: Negative for headaches, focal weakness or numbness.   ____________________________________________   PHYSICAL EXAM:  VITAL SIGNS: ED Triage Vitals  Enc Vitals Group     BP 07/28/18 2350 (!) 150/86     Pulse Rate 07/28/18 2350 84     Resp 07/28/18 2350 18     Temp 07/28/18 2350 98.4 F (36.9 C)     Temp Source 07/28/18 2350 Oral     SpO2 07/28/18 2350 97 %     Weight 07/28/18 2348 92.5 kg (204 lb)     Height 07/28/18 2348 1.626 m (5\' 4" )     Head Circumference --      Peak Flow --      Pain Score 07/28/18 2350 7     Pain Loc --      Pain Edu? --      Excl. in GC? --     Constitutional: Alert and oriented. Well appearing and in no acute distress. Eyes: Conjunctivae are normal.  Head: Atraumatic. Mouth/Throat: Mucous membranes are moist.  Oropharynx non-erythematous. Neck: No stridor.  No meningeal signs.  No cervical spine tenderness to palpation. Cardiovascular: Normal rate, regular rhythm. Good peripheral circulation. Grossly normal heart sounds. Respiratory: Normal respiratory effort.  No retractions. Lungs CTAB. Gastrointestinal: Soft and nontender. No distention.  Musculoskeletal: No lower extremity tenderness nor edema. No gross deformities of extremities.pain with thoracic paraspinal muscle palpation. Neurologic:  Normal speech and language. No gross focal neurologic deficits are appreciated.  Skin:  Skin is warm, dry and intact. No rash noted. Psychiatric: Mood and affect are normal. Speech and behavior are  normal.  ____________________________________________   LABS (all labs ordered are listed, but only abnormal results are displayed)  Labs Reviewed  URINALYSIS, COMPLETE (UACMP) WITH MICROSCOPIC - Abnormal; Notable for the following components:      Result Value   Color, Urine YELLOW (*)    APPearance CLEAR (*)    Protein, ur 30 (*)    Leukocytes, UA SMALL (*)    Bacteria, UA RARE (*)    All other components within normal limits  URINE CULTURE   ______  RADIOLOGY I, [place] N , personally viewed and evaluated these images (plain radiographs) as part of my medical decision making, as well as reviewing the written report by the radiologist.  ED MD interpretation:  mild degenerative changes in the thoracic and lumbar spineper  radiologist    Procedures   ____________________________________________   INITIAL IMPRESSION / ASSESSMENT AND PLAN / ED COURSE  As part of my medical decision making, I reviewed the following data within the electronic MEDICAL RECORD NUMBER   84 year old female presenting with above stated history of physical exam secondary to upper back pain. Patient with no chest pain with pain that is positional. Lidoderm patch applied to the patient's thoracic spine tramadol 50 mg tablet given with complete resolution of pain patient's urinalysis concerning for possibility of UTI in a patient with multiple episodes of UTI in the past and a such she was given fosfomycin 3 g. ____________________________________________  FINAL CLINICAL IMPRESSION(S) / ED DIAGNOSES  Final diagnoses:  Acute bilateral thoracic back pain     MEDICATIONS GIVEN DURING THIS VISIT:  Medications  traMADol (ULTRAM) tablet 100 mg (100 mg Oral Given 07/29/18 0303)  fosfomycin (MONUROL) packet 3 g (3 g Oral Given 07/29/18 0410)     ED Discharge Orders         Ordered    lidocaine (LIDODERM) 5 %  Every 24 hours     07/29/18 0437    traMADol (ULTRAM) 50 MG tablet  Every 6 hours PRN      07/29/18 0437           Note:  This document was prepared using Dragon voice recognition software and may include unintentional dictation errors.    Darci Current, MD 07/31/18 2249

## 2020-07-23 ENCOUNTER — Emergency Department: Payer: Medicare Other

## 2020-07-23 ENCOUNTER — Other Ambulatory Visit: Payer: Self-pay

## 2020-07-23 ENCOUNTER — Emergency Department
Admission: EM | Admit: 2020-07-23 | Discharge: 2020-07-23 | Disposition: A | Discharge status: Home / Self Care | Payer: Medicare Other | Attending: Emergency Medicine | Admitting: Emergency Medicine

## 2020-07-23 DIAGNOSIS — Z7982 Long term (current) use of aspirin: Secondary | ICD-10-CM | POA: Insufficient documentation

## 2020-07-23 DIAGNOSIS — M25561 Pain in right knee: Secondary | ICD-10-CM | POA: Insufficient documentation

## 2020-07-23 DIAGNOSIS — Z79899 Other long term (current) drug therapy: Secondary | ICD-10-CM | POA: Diagnosis not present

## 2020-07-23 DIAGNOSIS — M25551 Pain in right hip: Secondary | ICD-10-CM | POA: Insufficient documentation

## 2020-07-23 DIAGNOSIS — M79661 Pain in right lower leg: Secondary | ICD-10-CM | POA: Diagnosis present

## 2020-07-23 DIAGNOSIS — M79604 Pain in right leg: Secondary | ICD-10-CM

## 2020-07-23 DIAGNOSIS — I10 Essential (primary) hypertension: Secondary | ICD-10-CM | POA: Diagnosis not present

## 2020-07-23 MED ORDER — TRAMADOL HCL 50 MG PO TABS
50.0000 mg | ORAL_TABLET | Freq: Once | ORAL | Status: AC
  Administered 2020-07-23: 50 mg via ORAL
  Filled 2020-07-23: qty 1

## 2020-07-23 MED ORDER — TRAMADOL HCL 50 MG PO TABS: 50.0000 mg | ORAL_TABLET | Freq: Three times a day (TID) | ORAL | 0 refills | Status: DC | PRN

## 2020-07-23 NOTE — ED Notes (Signed)
New RX and f/up discussed with pt. NAD noted.

## 2020-07-23 NOTE — ED Notes (Addendum)
Pt presents to ED via EMS with c/o of R hip and R knee pain. Pt denies fall or trauma to these areas. Pt states the R hip pain woke her up from her sleep. Pt denies numbness or tingling, R pedal pulse intact, CMS intact. Pt denies pain at this time while laying still but states she does have issues ambulating due to pain. Pt states she normally uses a walker to ambulate. Pt is A&Ox4 and states taking tylenol PTA but unsure of the dose.

## 2020-07-23 NOTE — ED Notes (Signed)
Pt assisted to bathroom

## 2020-07-23 NOTE — ED Provider Notes (Signed)
[facility] Emergency Department Provider Note  ____________________________________________   First MD Initiated Contact with Patient 07/23/20 1200     (approximate)  I have reviewed the triage vital signs and the nursing notes.   HISTORY  Chief Complaint Hip Pain    HPI Victoria Edwards is a 86 y.o. female presents to the ED via EMS with complaint of right hip and knee pain woke her up from her sleep this morning.  Patient denies any recent injury or fall.  Patient states that she normally uses a walker to ambulate however any movement of her right lower extremity increases her pain.  Patient has not taken any over-the-counter medication.  Currently lying still patient denies any pain.       Past Medical History:  Diagnosis Date  . Gout   . Hypertension   . Kidney calculus   . Kidney stones     There are no problems to display for this patient.   Past Surgical History:  Procedure Laterality Date  . APPENDECTOMY    . CHOLECYSTECTOMY    . TONSILLECTOMY      Prior to Admission medications   Medication Sig Start Date End Date Taking? Authorizing Provider  allopurinol (ZYLOPRIM) 300 MG tablet Take 300 mg by mouth daily.    [provider]  aspirin EC 81 MG tablet Take 81 mg by mouth daily.    [provider]  Cholecalciferol (VITAMIN D3) 2000 UNITS TABS Take 2,000 Units by mouth at bedtime.    [provider]  Coconut Oil (EQL COCONUT OIL) 1000 MG CAPS Take 1,000 mg by mouth daily.    [provider]  diazepam (VALIUM) 5 MG tablet Take 2.5 mg by mouth at bedtime as needed (for sleep).    [provider]  glipiZIDE (GLUCOTROL XL) 5 MG 24 hr tablet Take 5 mg by mouth daily.    [provider]  ipratropium (ATROVENT) 0.03 % nasal spray Place 2 sprays into both nostrils 2 (two) times daily.    [provider]  ketoconazole (NIZORAL) 2 % shampoo Apply 1 application topically every 14  (fourteen) days.    [provider]  levothyroxine (SYNTHROID, LEVOTHROID) 25 MCG tablet Take 25 mcg by mouth daily before breakfast.    [provider]  lidocaine (LIDODERM) 5 % Place 1 patch onto the skin daily. Remove & Discard patch within 12 hours or as directed by MD 07/29/18   Darci Current, MD  lisinopril (PRINIVIL,ZESTRIL) 20 MG tablet Take 20 mg by mouth daily.    [provider]  meclizine (ANTIVERT) 25 MG tablet Take 1 tablet (25 mg total) by mouth 3 (three) times daily as needed for dizziness. 07/15/15   Myrna Blazer, MD  metoprolol (LOPRESSOR) 50 MG tablet Take 50 mg by mouth 2 (two) times daily.    [provider]  mometasone (ELOCON) 0.1 % lotion Apply 1 application topically daily as needed (for itching on ears).    [provider]  Multiple Vitamin (MULTIVITAMIN WITH MINERALS) TABS tablet Take 1 tablet by mouth daily.    [provider]  Omega-3 Fatty Acids (FISH OIL) 1000 MG CAPS Take 1,000 mg by mouth 3 (three) times daily.    [provider]  omeprazole (PRILOSEC) 20 MG capsule Take 20 mg by mouth 2 (two) times daily.    [provider]  Red Yeast Rice 600 MG CAPS Take 600 mg by mouth daily.    [provider]  sertraline (ZOLOFT) 25 MG tablet Take 25 mg by mouth daily.    [provider]  sulfamethoxazole-trimethoprim (BACTRIM DS,SEPTRA DS) 800-160 MG tablet Take 1 tablet by mouth 2 (two) times daily. 03/24/16   Triplett, Rulon Eisenmenger B, FNP  traMADol (ULTRAM) 50 MG tablet Take 1 tablet (50 mg total) by mouth every 8 (eight) hours as needed for moderate pain or severe pain. 07/23/20   Tommi Rumps, PA-C    Allergies Demerol [meperidine], Shellfish allergy, Statins, and Penicillins  Family History  Problem Relation Age of Onset  . Breast cancer Neg Hx     Social History Social History   Tobacco Use  . Smoking status: Never Smoker  . Smokeless tobacco: Never Used    Substance Use Topics  . Alcohol use: No  . Drug use: No    Review of Systems Constitutional: No fever/chills Cardiovascular: Denies chest pain. Respiratory: Denies shortness of breath. Gastrointestinal: No abdominal pain.  No nausea, no vomiting.  Genitourinary: Negative for dysuria. Musculoskeletal: Positive for chronic back pain.  Positive for right lower extremity pain. Skin: Negative for rash. Neurological: Negative for headaches, focal weakness or numbness. ____________________________________________   PHYSICAL EXAM:  VITAL SIGNS: ED Triage Vitals  Enc Vitals Group     BP 07/23/20 1154 129/73     Pulse Rate 07/23/20 1154 85     Resp 07/23/20 1154 18     Temp 07/23/20 1154 98.6 F (37 C)     Temp Source 07/23/20 1154 Oral     SpO2 07/23/20 1154 98 %     Weight 07/23/20 1150 173 lb (78.5 kg)     Height 07/23/20 1150 5\' 4"  (1.626 m)     Head Circumference --      Peak Flow --      Pain Score 07/23/20 1150 0     Pain Loc --      Pain Edu? --      Excl. in GC? --     Constitutional: Alert and oriented. Well appearing and in no acute distress. Eyes: Conjunctivae are normal.  Head: Atraumatic. Neck: No stridor.   Cardiovascular: Normal rate, regular rhythm. Grossly normal heart sounds.  Good peripheral circulation. Respiratory: Normal respiratory effort.  No retractions. Lungs CTAB. Gastrointestinal: Soft and nontender. No distention.  Bowel sounds normoactive x4 quadrants. Musculoskeletal: On examination of the right lower extremity there is no gross deformity and no erythema or warmth is noted.  Patient is moderately tender to palpation of the right knee anteriorly.  No effusion is appreciated.  Range of motion is reduced secondary to patient's discomfort.  Right hip range of motion is also decreased secondary to patient's discomfort.  No gross deformity was noted.  No erythema or warmth is noted on palpation inspection of the entire right lower extremity.  07/25/20'  sign was negative.  Pulses were intact both DP and PP.  Motor and sensory function intact.  Skin without discoloration or abrasions noted. Neurologic:  Normal speech and language. No gross focal neurologic deficits are appreciated. No gait instability. Skin:  Skin is warm, dry and intact. No rash noted. Psychiatric: Mood and affect are normal. Speech and behavior are normal.  ____________________________________________   LABS (all labs ordered are listed, but only abnormal results are displayed)  Labs Reviewed - No data to display   RADIOLOGY I, Denna Haggard, personally viewed and evaluated these images (plain radiographs) as part of my medical decision making, as well as reviewing the written  report by the radiologist.   Official radiology report(s): DG Pelvis 1-2 Views  Result Date: 07/23/2020 CLINICAL DATA:  Right hip pain.  No recent trauma. EXAM: PELVIS - 1-2 VIEW COMPARISON:  03/08/2013 FINDINGS: Both hips are normally located. Moderate bilateral hip joint degenerative changes for age with mild progression since the prior study. No plain film findings for stress fracture or AVN. The pubic symphysis and SI joints are intact. No pelvic fractures or bone lesions. IMPRESSION: Moderate bilateral hip joint degenerative changes but no acute bony findings. Electronically Signed   By: Rudie MeyerP.  Gallerani M.D.   On: 07/23/2020 13:15   US Venous Img Lower Unilateral Right  Result Date: 07/23/2020 CLINICAL DATA:  Acute right lower extremity pain. EXAM: Right LOWER EXTREMITY VENOUS DOPPLER ULTRASOUND TECHNIQUE: Gray-scale sonography with compression, as well as color and duplex ultrasound, were performed to evaluate the deep venous system(s) from the level of the common femoral vein through the popliteal and proximal calf veins. COMPARISON:  October 01, 2008. FINDINGS: VENOUS Normal compressibility of the common femoral, superficial femoral, and popliteal veins, as well as the visualized calf  veins. Visualized portions of profunda femoral vein and great saphenous vein unremarkable. No filling defects to suggest DVT on grayscale or color Doppler imaging. Doppler waveforms show normal direction of venous flow, normal respiratory plasticity and response to augmentation. Limited views of the contralateral common femoral vein are unremarkable. OTHER None. Limitations: none IMPRESSION: Negative. Electronically Signed   By: Lupita RaiderJames  Green Jr M.D.   On: 07/23/2020 14:40   DG Femur Min 2 Views Right  Result Date: 07/23/2020 CLINICAL DATA:  Right leg pain. EXAM: RIGHT FEMUR 2 VIEWS COMPARISON:  None. FINDINGS: Moderate right hip and right knee joint degenerative changes. No acute bony abnormality involving the right femur. No fracture or bone lesion. IMPRESSION: No acute bony findings. Electronically Signed   By: Rudie MeyerP.  Gallerani M.D.   On: 07/23/2020 13:16    ____________________________________________   PROCEDURES  Procedure(s) performed (including Critical Care):  Procedures   ____________________________________________   INITIAL IMPRESSION / ASSESSMENT AND PLAN / ED COURSE  As part of my medical decision making, I reviewed the following data within the electronic MEDICAL RECORD NUMBER Notes from prior ED visits and [place] Controlled Substance Database  86 year old female presents to the ED via EMS with complaint of right lower extremity pain without history of injury.  Patient states that pain is increased with range of motion.  She reports that she generally uses a walker to walk.  In the past she has taken tramadol for pain but did not have a prescription and has not taken any over-the-counter medication as she was told not to take anything with Tylenol in it.  X-ray evaluation of the right lower extremity and pelvis was negative for any acute bony injury and showed some degenerative changes.  Patient was given tramadol while in the ED.  Because of her history of pain without injury she agreed  to have a venous ultrasound done to rule out DVT.  After results were reviewed patient was reassured that there was no DVT and she states that her pain is much better.  She is able to tolerate the medicine and with some minimal assistance did go to the bathroom.  Plans are for her now to stay with a family member tonight.  Patient is aware that she should not take medication and be walking around without supervision and the use of her walker.  She understands that being drowsy with  this medication could increase her chances for falling.  She is to return to the emergency department if any severe worsening of her symptoms otherwise she is to follow-up with her PCP. ____________________________________________   FINAL CLINICAL IMPRESSION(S) / ED DIAGNOSES  Final diagnoses:  Acute pain of right lower extremity     ED Discharge Orders         Ordered    traMADol (ULTRAM) 50 MG tablet  Every 8 hours PRN        07/23/20 1505          *Please note:  Victoria Edwards was evaluated in Emergency Department on 07/23/2020 for the symptoms described in the history of present illness. She was evaluated in the context of the global COVID-19 pandemic, which necessitated consideration that the patient might be at risk for infection with the SARS-CoV-2 virus that causes COVID-19. Institutional protocols and algorithms that pertain to the evaluation of patients at risk for COVID-19 are in a state of rapid change based on information released by regulatory bodies including the CDC and federal and state organizations. These policies and algorithms were followed during the patient's care in the ED.  Some ED evaluations and interventions may be delayed as a result of limited staffing during and the pandemic.*   Note:  This document was prepared using Dragon voice recognition software and may include unintentional dictation errors.    Tommi Rumps, PA-C 07/23/20 1521    Shaune Pollack, MD 07/24/20 [phone]

## 2020-07-23 NOTE — ED Notes (Signed)
Pt at XRAY

## 2020-07-23 NOTE — Discharge Instructions (Addendum)
Follow-up with your primary care provider if any continued problems or concerns.  X-rays of your right lower extremity shows degenerative changes but no fractures were seen.  Ultrasound of your right lower extremity did not show any blood clots.  A prescription for tramadol was sent to your pharmacy.  Take this every 8 hours if needed for pain.  Be aware that this medication could cause drowsiness and increase your risk for falling.  If any severe worsening of your symptoms over the holiday weekend he may return to the emergency department.

## 2020-08-12 ENCOUNTER — Other Ambulatory Visit: Payer: Self-pay | Admitting: Internal Medicine

## 2020-08-12 DIAGNOSIS — R194 Change in bowel habit: Secondary | ICD-10-CM

## 2020-08-12 DIAGNOSIS — D649 Anemia, unspecified: Secondary | ICD-10-CM

## 2020-08-12 DIAGNOSIS — R634 Abnormal weight loss: Secondary | ICD-10-CM

## 2020-08-15 ENCOUNTER — Ambulatory Visit
Admission: RE | Admit: 2020-08-15 | Discharge: 2020-08-15 | Disposition: A | Discharge status: Home / Self Care | Payer: Medicare Other | Source: Ambulatory Visit | Attending: Internal Medicine | Admitting: Internal Medicine

## 2020-08-15 ENCOUNTER — Other Ambulatory Visit: Payer: Self-pay

## 2020-08-15 DIAGNOSIS — D649 Anemia, unspecified: Secondary | ICD-10-CM | POA: Diagnosis present

## 2020-08-15 DIAGNOSIS — R634 Abnormal weight loss: Secondary | ICD-10-CM | POA: Diagnosis present

## 2020-08-15 DIAGNOSIS — R194 Change in bowel habit: Secondary | ICD-10-CM | POA: Diagnosis present

## 2020-10-08 ENCOUNTER — Other Ambulatory Visit: Payer: Self-pay | Admitting: Orthopedic Surgery

## 2020-10-08 DIAGNOSIS — M48061 Spinal stenosis, lumbar region without neurogenic claudication: Secondary | ICD-10-CM

## 2020-10-20 ENCOUNTER — Ambulatory Visit
Admission: RE | Admit: 2020-10-20 | Discharge: 2020-10-20 | Disposition: A | Discharge status: Home / Self Care | Payer: Medicare Other | Source: Ambulatory Visit | Attending: Orthopedic Surgery | Admitting: Orthopedic Surgery

## 2020-10-20 ENCOUNTER — Other Ambulatory Visit: Payer: Self-pay

## 2020-10-20 DIAGNOSIS — M48061 Spinal stenosis, lumbar region without neurogenic claudication: Secondary | ICD-10-CM | POA: Insufficient documentation

## 2021-01-31 ENCOUNTER — Emergency Department
Admission: EM | Admit: 2021-01-31 | Discharge: 2021-02-01 | Disposition: A | Discharge status: Home / Self Care | Payer: Medicare Other | Attending: Emergency Medicine | Admitting: Emergency Medicine

## 2021-01-31 ENCOUNTER — Other Ambulatory Visit: Payer: Self-pay

## 2021-01-31 ENCOUNTER — Emergency Department: Payer: Medicare Other

## 2021-01-31 DIAGNOSIS — Y92009 Unspecified place in unspecified non-institutional (private) residence as the place of occurrence of the external cause: Secondary | ICD-10-CM | POA: Diagnosis not present

## 2021-01-31 DIAGNOSIS — S0003XA Contusion of scalp, initial encounter: Secondary | ICD-10-CM | POA: Diagnosis not present

## 2021-01-31 DIAGNOSIS — Z7982 Long term (current) use of aspirin: Secondary | ICD-10-CM | POA: Diagnosis not present

## 2021-01-31 DIAGNOSIS — Z79899 Other long term (current) drug therapy: Secondary | ICD-10-CM | POA: Insufficient documentation

## 2021-01-31 DIAGNOSIS — I10 Essential (primary) hypertension: Secondary | ICD-10-CM | POA: Insufficient documentation

## 2021-01-31 DIAGNOSIS — W19XXXA Unspecified fall, initial encounter: Secondary | ICD-10-CM

## 2021-01-31 DIAGNOSIS — S0990XA Unspecified injury of head, initial encounter: Secondary | ICD-10-CM | POA: Diagnosis present

## 2021-01-31 DIAGNOSIS — W01198A Fall on same level from slipping, tripping and stumbling with subsequent striking against other object, initial encounter: Secondary | ICD-10-CM | POA: Diagnosis not present

## 2021-01-31 NOTE — ED Triage Notes (Addendum)
Pt presents to ER after having a fall at home.  Pt states her legs got tangled, causing her to fall backwards, and hit the back of her head on the coffee table.  Pt states she did not have any LOC, but does endorse some temporary amnesia after the fall.  Pt does not take any blood thinners, except for daily asa.  Pt currently A&O x4 with no neurological deficits.  Pt has hematoma with bleeding noted to area, but is controlled at this time.

## 2021-01-31 NOTE — ED Provider Notes (Signed)
[facility] Emergency Department Provider Note  ____________________________________________   I have reviewed the triage vital signs and the nursing notes.   HISTORY  Chief Complaint Head Laceration   History limited by: Not Limited   HPI Victoria Edwards is a 87 y.o. female who presents to the emergency department today because of concerns for fall and laceration to the back of her head.  Patient states that she got tripped up and fell backwards.  Her head hit the back of the coffee table.  She is unsure if she lost consciousness although does not remember everything that happened.  She is complaining of some pain right around the site of the injury but denies any other headache.  Patient denies any pain in her extremities.  Denies any chest pain palpitations or lightheadedness prior to the fall. States she is on aspirin but denies any other blood thinners.    Records reviewed. Per medical record review patient has a history of HTN.   Past Medical History:  Diagnosis Date  . Gout   . Hypertension   . Kidney calculus   . Kidney stones     There are no problems to display for this patient.   Past Surgical History:  Procedure Laterality Date  . APPENDECTOMY    . CHOLECYSTECTOMY    . TONSILLECTOMY      Prior to Admission medications   Medication Sig Start Date End Date Taking? Authorizing Provider  allopurinol (ZYLOPRIM) 300 MG tablet Take 300 mg by mouth daily.    [provider]  aspirin EC 81 MG tablet Take 81 mg by mouth daily.    [provider]  Cholecalciferol (VITAMIN D3) 2000 UNITS TABS Take 2,000 Units by mouth at bedtime.    [provider]  Coconut Oil (EQL COCONUT OIL) 1000 MG CAPS Take 1,000 mg by mouth daily.    [provider]  diazepam (VALIUM) 5 MG tablet Take 2.5 mg by mouth at bedtime as needed (for sleep).    [provider]  glipiZIDE (GLUCOTROL XL) 5 MG 24 hr tablet Take 5 mg by mouth  daily.    [provider]  ipratropium (ATROVENT) 0.03 % nasal spray Place 2 sprays into both nostrils 2 (two) times daily.    [provider]  ketoconazole (NIZORAL) 2 % shampoo Apply 1 application topically every 14 (fourteen) days.    [provider]  levothyroxine (SYNTHROID, LEVOTHROID) 25 MCG tablet Take 25 mcg by mouth daily before breakfast.    [provider]  lidocaine (LIDODERM) 5 % Place 1 patch onto the skin daily. Remove & Discard patch within 12 hours or as directed by MD 07/29/18   Darci Current, MD  lisinopril (PRINIVIL,ZESTRIL) 20 MG tablet Take 20 mg by mouth daily.    [provider]  meclizine (ANTIVERT) 25 MG tablet Take 1 tablet (25 mg total) by mouth 3 (three) times daily as needed for dizziness. 07/15/15   Myrna Blazer, MD  metoprolol (LOPRESSOR) 50 MG tablet Take 50 mg by mouth 2 (two) times daily.    [provider]  mometasone (ELOCON) 0.1 % lotion Apply 1 application topically daily as needed (for itching on ears).    [provider]  Multiple Vitamin (MULTIVITAMIN WITH MINERALS) TABS tablet Take 1 tablet by mouth daily.    [provider]  Omega-3 Fatty Acids (FISH OIL) 1000 MG CAPS Take 1,000 mg by mouth 3 (three) times daily.    [provider]  omeprazole (PRILOSEC) 20 MG capsule Take 20 mg by mouth 2 (two) times daily.    [provider]  Red Yeast Rice 600 MG CAPS Take 600 mg by mouth daily.    [provider]  sertraline (ZOLOFT) 25 MG tablet Take 25 mg by mouth daily.    [provider]  sulfamethoxazole-trimethoprim (BACTRIM DS,SEPTRA DS) 800-160 MG tablet Take 1 tablet by mouth 2 (two) times daily. 03/24/16   Triplett, Rulon Eisenmenger B, FNP  traMADol (ULTRAM) 50 MG tablet Take 1 tablet (50 mg total) by mouth every 8 (eight) hours as needed for moderate pain or severe pain. 07/23/20   Tommi Rumps, PA-C    Allergies Demerol [meperidine],  Shellfish allergy, Statins, and Penicillins  Family History  Problem Relation Age of Onset  . Breast cancer Neg Hx     Social History Social History   Tobacco Use  . Smoking status: Never Smoker  . Smokeless tobacco: Never Used  Substance Use Topics  . Alcohol use: No  . Drug use: No    Review of Systems Constitutional: No fever/chills Eyes: No visual changes. ENT: No sore throat. Cardiovascular: Denies chest pain. Respiratory: Denies shortness of breath. Gastrointestinal: No abdominal pain.  No nausea, no vomiting.  No diarrhea.   Genitourinary: Negative for dysuria. Musculoskeletal: Negative for back pain. Skin: Laceration to back of head. Neurological: Negative for headaches, focal weakness or numbness.  ____________________________________________   PHYSICAL EXAM:  VITAL SIGNS: ED Triage Vitals  Enc Vitals Group     BP 01/31/21 2134 (!) 137/112     Pulse Rate 01/31/21 2134 67     Resp 01/31/21 2134 17     Temp 01/31/21 2134 98.8 F (37.1 C)     Temp Source 01/31/21 2134 Oral     SpO2 01/31/21 2134 98 %     Weight 01/31/21 2135 175 lb (79.4 kg)     Height 01/31/21 2135 5\' 4"  (1.626 m)     Head Circumference --      Peak Flow --      Pain Score 01/31/21 2134 4   Constitutional: Alert and oriented.  Eyes: Conjunctivae are normal.  ENT      Head: Normocephalic. Hematoma to back of her head, very small punctate lesion with no active bleeding.       Nose: No congestion/rhinnorhea.      Mouth/Throat: Mucous membranes are moist.      Neck: No stridor. Hematological/Lymphatic/Immunilogical: No cervical lymphadenopathy. Cardiovascular: Normal rate, regular rhythm.  No murmurs, rubs, or gallops.  Respiratory: Normal respiratory effort without tachypnea nor retractions. Breath sounds are clear and equal bilaterally. No wheezes/rales/rhonchi. Gastrointestinal: Soft and non tender. No rebound. No guarding.  Genitourinary: Deferred Musculoskeletal: Normal range of  motion in all extremities. No lower extremity edema. Neurologic:  Normal speech and language. No gross focal neurologic deficits are appreciated.  Skin:  Skin is warm, dry and intact. No rash noted. Psychiatric: Mood and affect are normal. Speech and behavior are normal. Patient exhibits appropriate insight and judgment.  ____________________________________________    LABS (pertinent positives/negatives)  None  ____________________________________________   EKG  None  ____________________________________________    RADIOLOGY  No acute intracranial abnormality. No acute osseous abnormality. Question some dental disease.  ____________________________________________   PROCEDURES  Procedures  ____________________________________________   INITIAL IMPRESSION / ASSESSMENT AND PLAN / ED COURSE  Pertinent labs & imaging results that were available during my care of the patient were reviewed by me and  considered in my medical decision making (see chart for details).   Patient presented to the emergency department today because of concerns for fall and lacerated the back of her head.  When we cleaned the area the back of her head she did have a hematoma there with a very small punctate lesion with no active bleeding.  At this time no advanced closure necessary.  CT scans were obtained which not show any acute intracranial bleed or osseous injury.  It did show possibility of dental disease.  I discussed this with the patient and family and did recommend dental follow-up.  ____________________________________________   FINAL CLINICAL IMPRESSION(S) / ED DIAGNOSES  Final diagnoses:  Fall, initial encounter  Hematoma of scalp, initial encounter     Note: This dictation was prepared with Nurse, children's dictation. Any transcriptional errors that result from this process are unintentional     Phineas Semen, MD 01/31/21 2350

## 2021-01-31 NOTE — Discharge Instructions (Addendum)
Please seek medical attention for any high fevers, chest pain, shortness of breath, change in behavior, persistent vomiting, bloody stool or any other new or concerning symptoms.  

## 2022-04-20 IMAGING — CR DG FEMUR 2+V*R*
4 series · 4 of 4 positions shown · non-contrast
Comparison: None.

CLINICAL DATA: Right leg pain.

EXAM:
RIGHT FEMUR 2 VIEWS

[femur ap (1 of 2)]
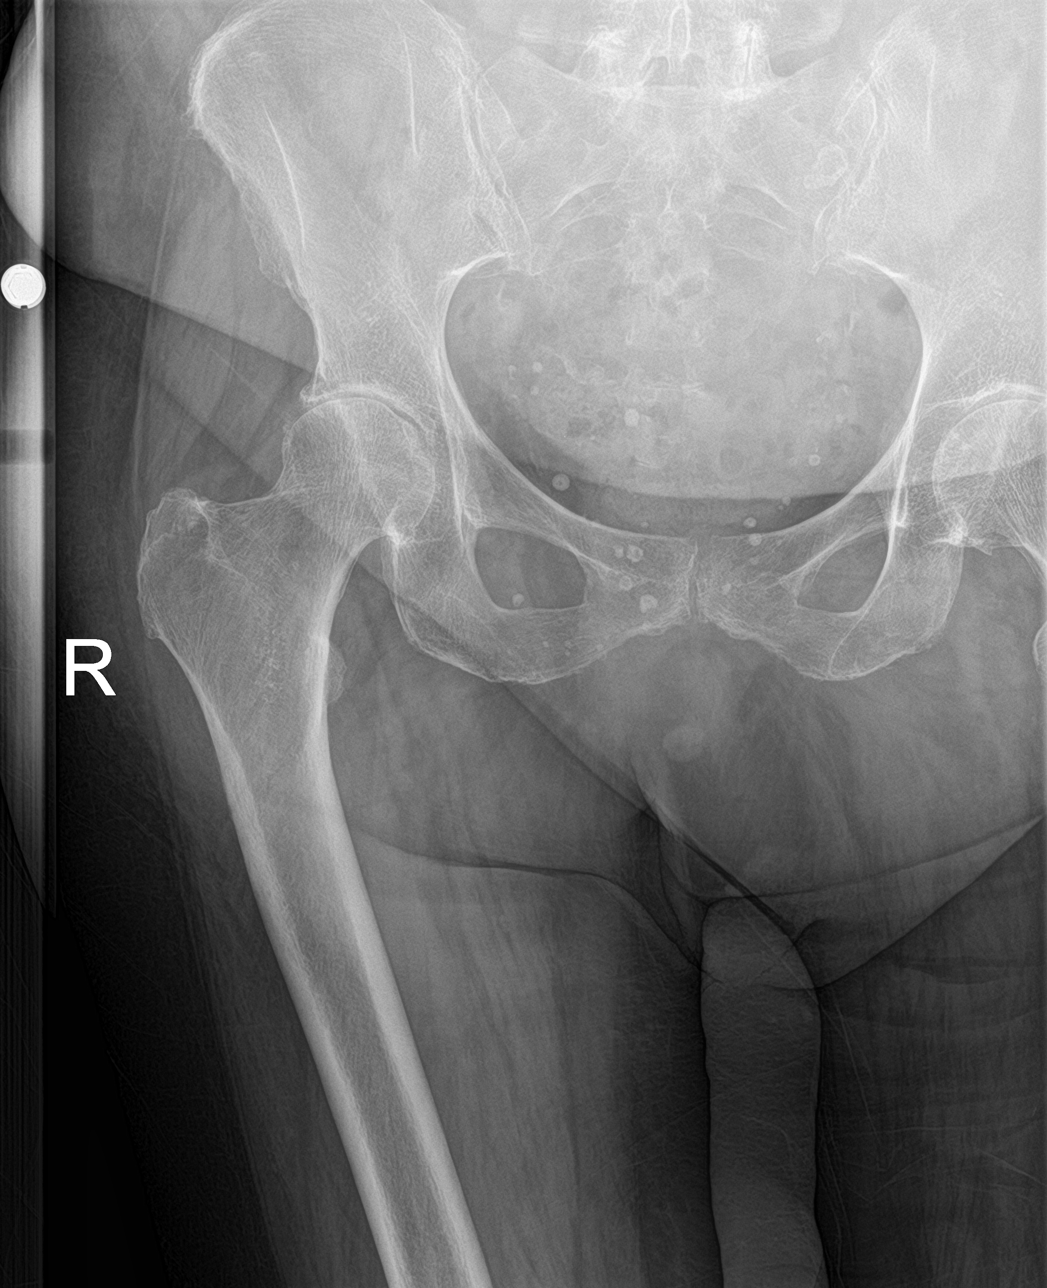

[femur ap (2 of 2)]
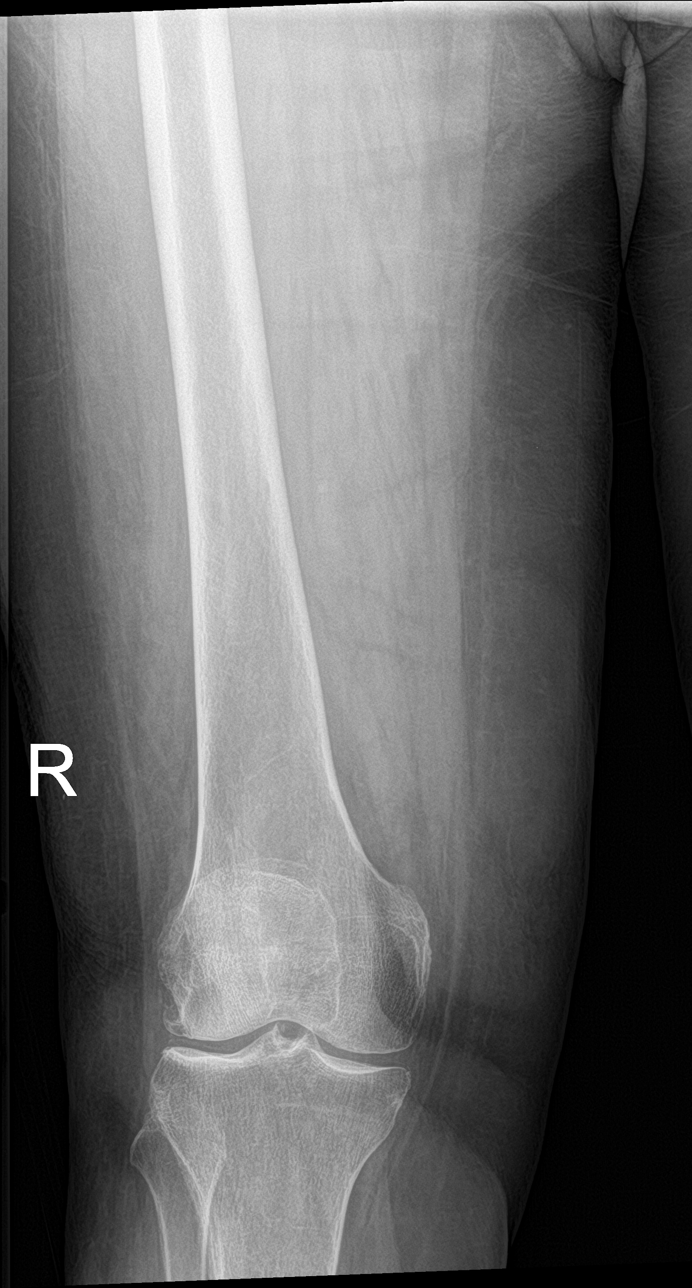

[femur lat (1 of 2)]
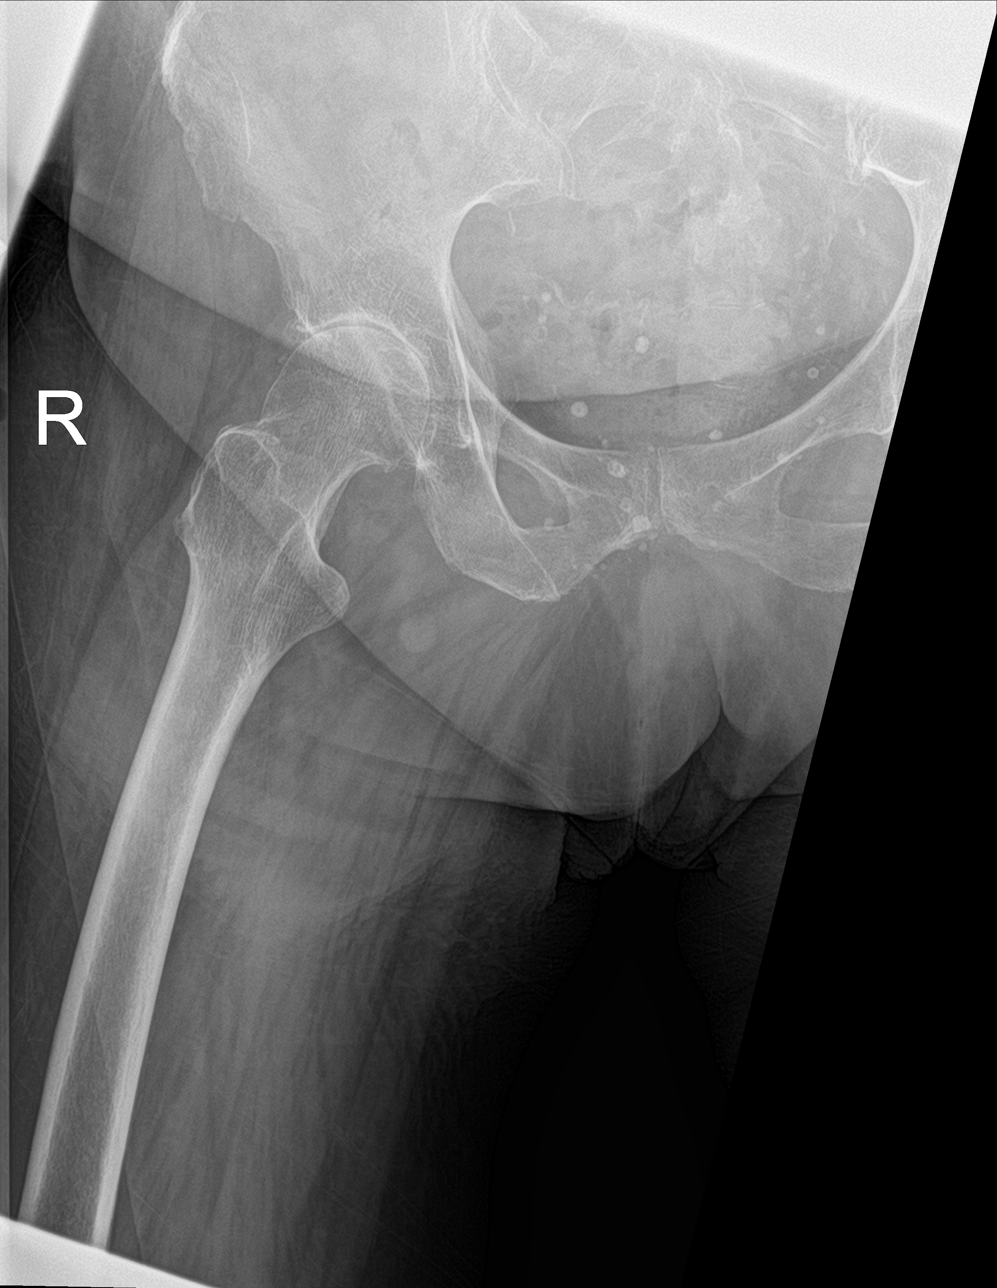

[femur lat (2 of 2)]
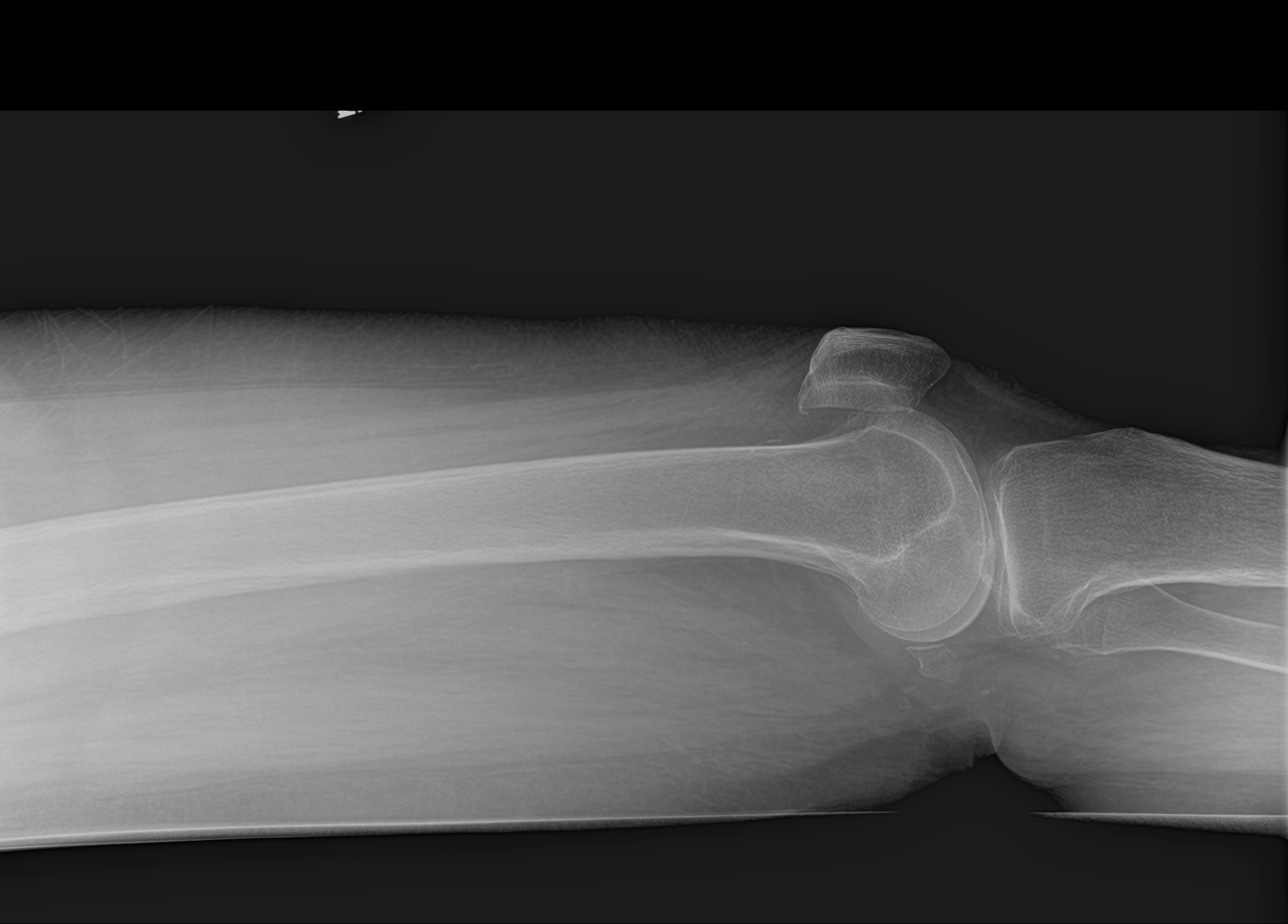

[4 of 4 positions shown; findings below may reference images not displayed]

FINDINGS: Moderate right hip and right knee joint degenerative changes. No
acute bony abnormality involving the right femur. No fracture or
bone lesion.
IMPRESSION: No acute bony findings.

## 2022-04-20 IMAGING — US US EXTREM LOW VENOUS*R*
1 series · 14 of 24 positions shown · non-contrast
Comparison: October 01, 2008.

CLINICAL DATA: Acute right lower extremity pain.

EXAM:
Right LOWER EXTREMITY VENOUS DOPPLER ULTRASOUND
TECHNIQUE: Gray-scale sonography with compression, as well as color and duplex
ultrasound, were performed to evaluate the deep venous system(s)
from the level of the common femoral vein through the popliteal and
proximal calf veins.

[Series 1: us venous img lower uni right (dvt) · portal-venous · 14 of 67 slices shown]
[im 1/67]
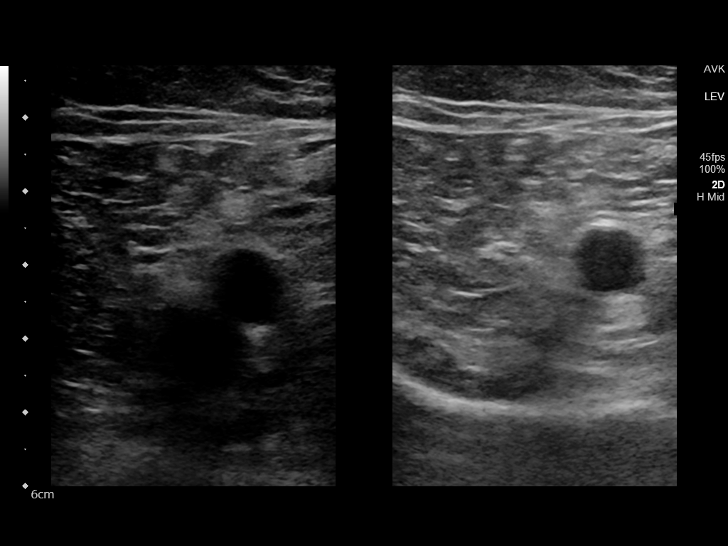
[im 6/67]
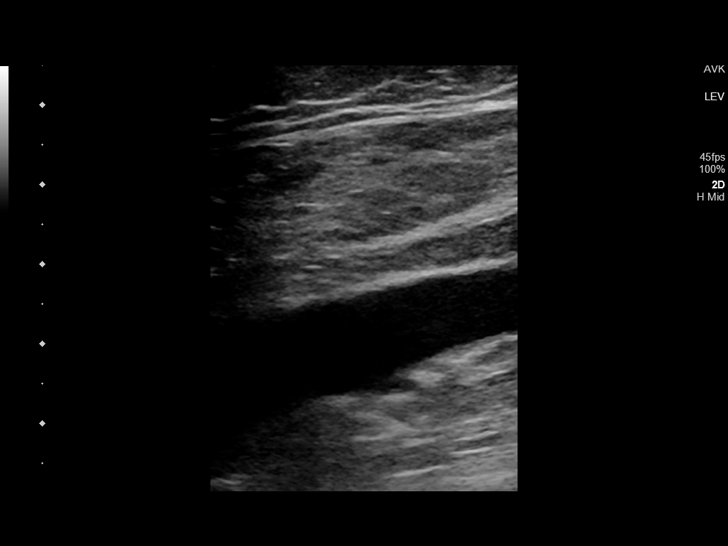
[im 12/67]
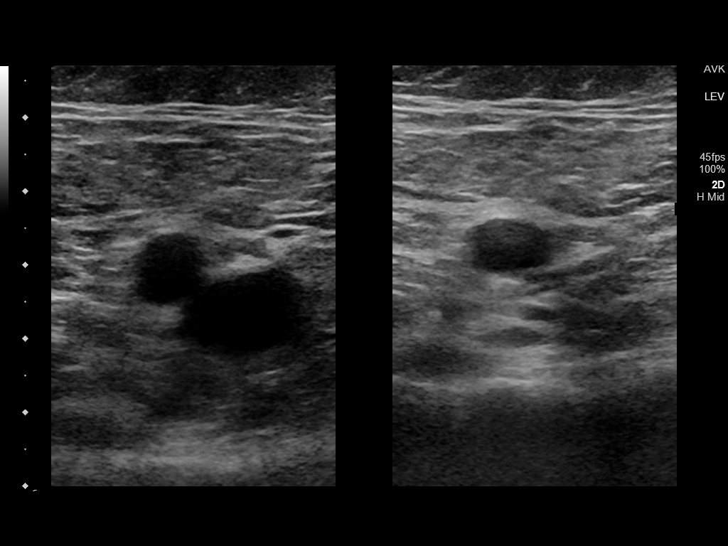
[im 18/67]
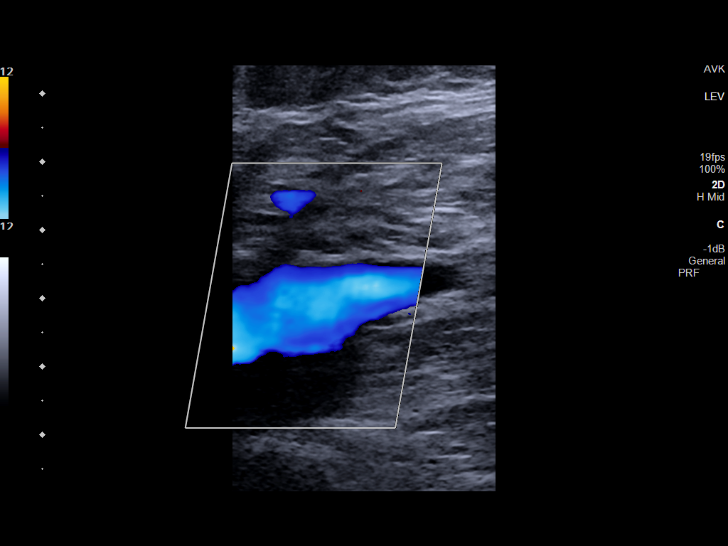
[im 21/67]
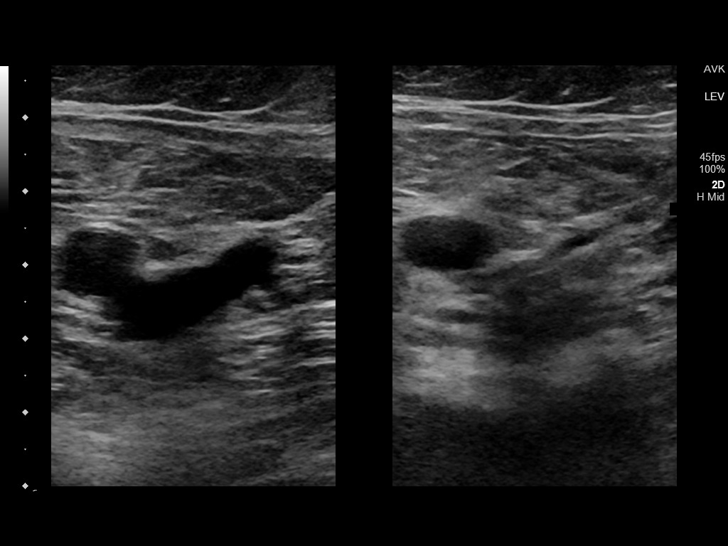
[im 26/67]
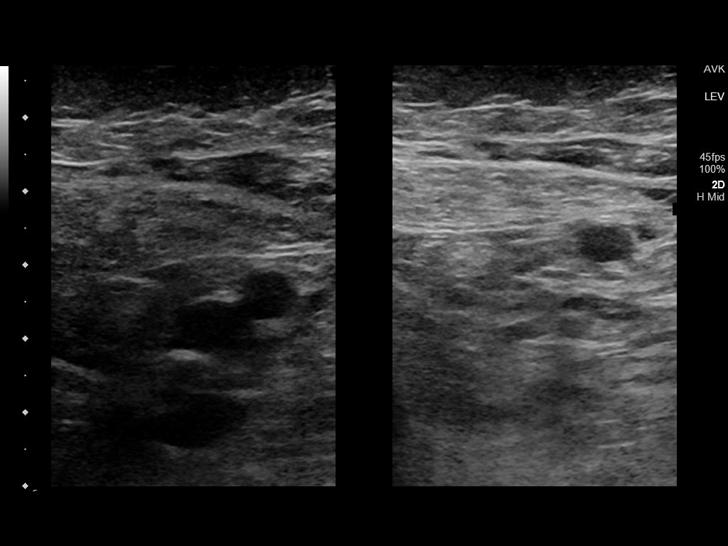
[im 32/67]
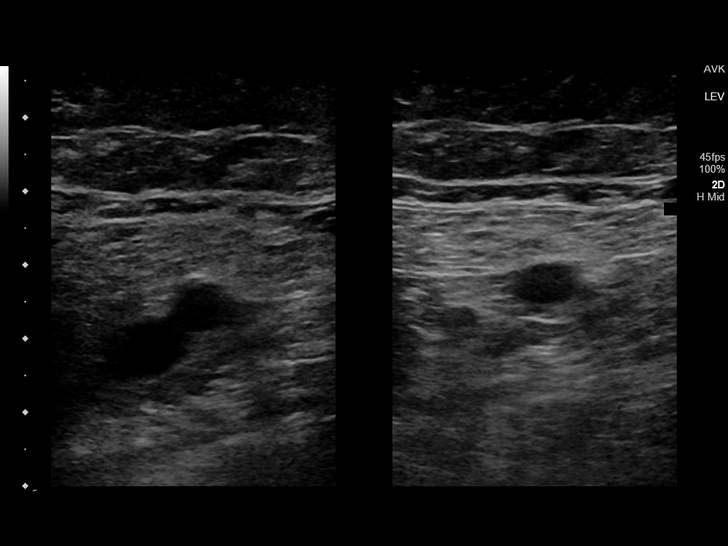
[im 35/67]
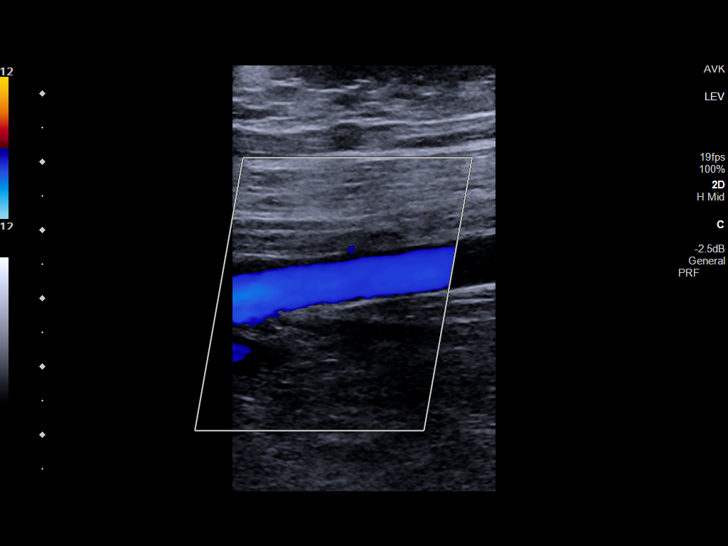
[im 41/67]
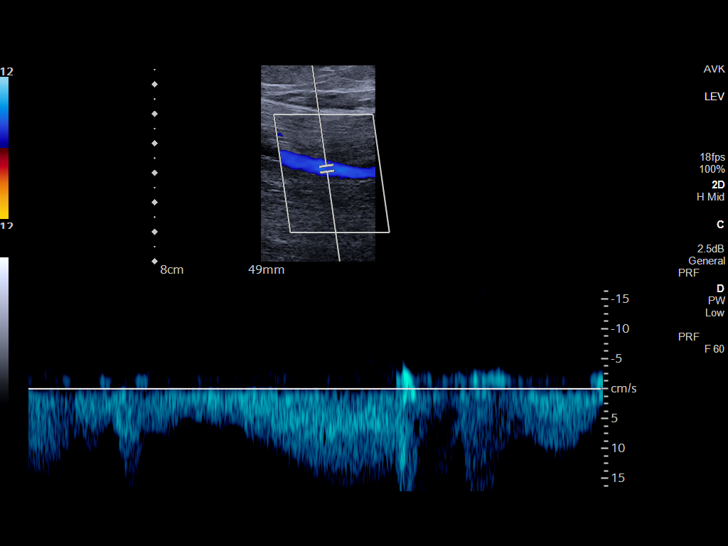
[im 46/67]
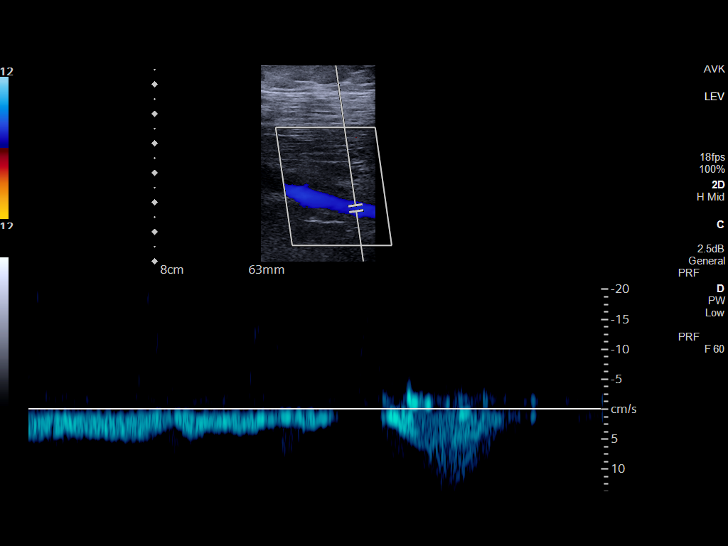
[im 52/67]
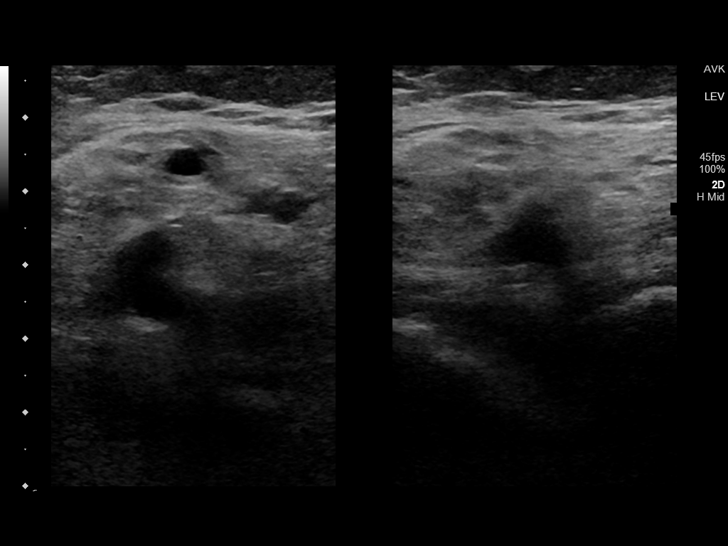
[im 55/67]
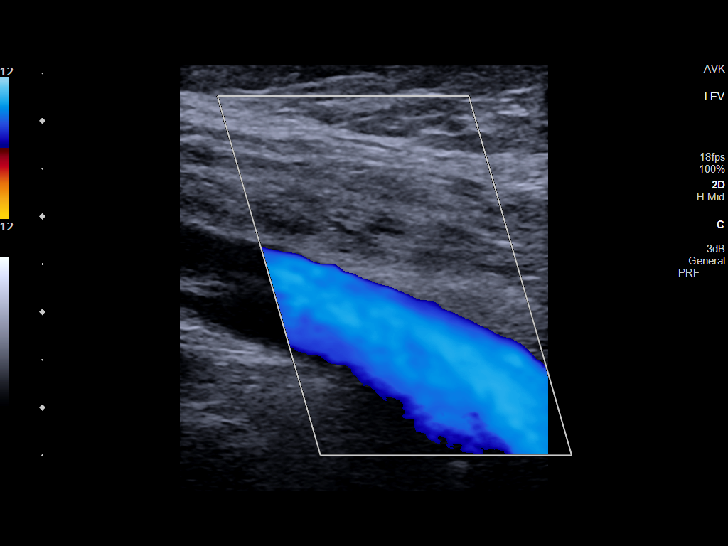
[im 61/67]
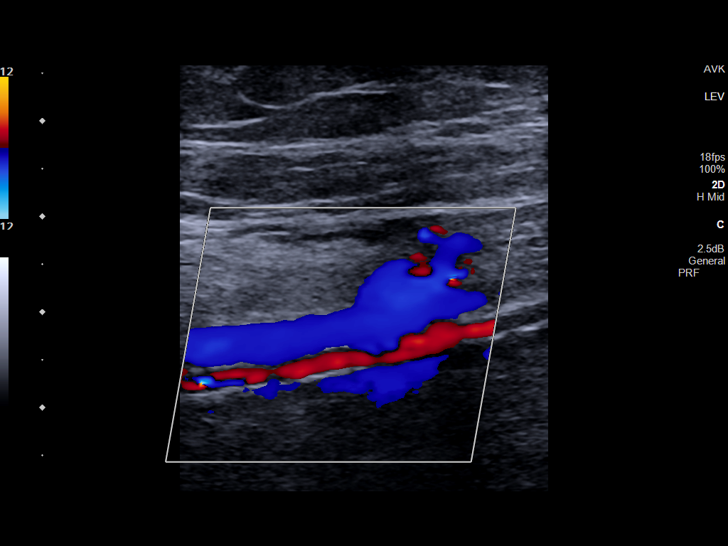
[im 67/67]
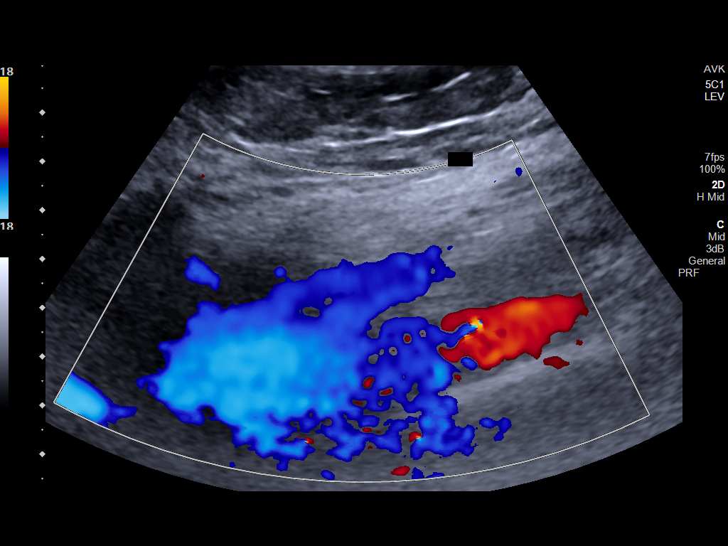

[14 of 24 positions shown; findings below may reference images not displayed]

FINDINGS: VENOUS

Normal compressibility of the common femoral, superficial femoral,
and popliteal veins, as well as the visualized calf veins.
Visualized portions of profunda femoral vein and great saphenous
vein unremarkable. No filling defects to suggest DVT on grayscale or
color Doppler imaging. Doppler waveforms show normal direction of
venous flow, normal respiratory plasticity and response to
augmentation.

Limited views of the contralateral common femoral vein are
unremarkable.

OTHER

None.

Limitations: none
IMPRESSION: Negative.

## 2023-12-22 ENCOUNTER — Ambulatory Visit: Payer: Medicare Other | Admitting: Dermatology

## 2023-12-24 ENCOUNTER — Emergency Department

## 2023-12-24 ENCOUNTER — Other Ambulatory Visit: Payer: Self-pay

## 2023-12-24 ENCOUNTER — Emergency Department
Admission: EM | Admit: 2023-12-24 | Discharge: 2023-12-24 | Disposition: A | Discharge status: Home / Self Care | Attending: Emergency Medicine | Admitting: Emergency Medicine

## 2023-12-24 DIAGNOSIS — R6883 Chills (without fever): Secondary | ICD-10-CM | POA: Insufficient documentation

## 2023-12-24 DIAGNOSIS — N189 Chronic kidney disease, unspecified: Secondary | ICD-10-CM | POA: Insufficient documentation

## 2023-12-24 DIAGNOSIS — R55 Syncope and collapse: Secondary | ICD-10-CM | POA: Insufficient documentation

## 2023-12-24 DIAGNOSIS — J029 Acute pharyngitis, unspecified: Secondary | ICD-10-CM | POA: Diagnosis not present

## 2023-12-24 DIAGNOSIS — E119 Type 2 diabetes mellitus without complications: Secondary | ICD-10-CM | POA: Diagnosis not present

## 2023-12-24 DIAGNOSIS — I129 Hypertensive chronic kidney disease with stage 1 through stage 4 chronic kidney disease, or unspecified chronic kidney disease: Secondary | ICD-10-CM | POA: Diagnosis not present

## 2023-12-24 DIAGNOSIS — R051 Acute cough: Secondary | ICD-10-CM | POA: Diagnosis not present

## 2023-12-24 DIAGNOSIS — R197 Diarrhea, unspecified: Secondary | ICD-10-CM | POA: Insufficient documentation

## 2023-12-24 LAB — RESP PANEL BY RT-PCR (RSV, FLU A&B, COVID)  RVPGX2
Influenza A by PCR: NEGATIVE
Influenza B by PCR: NEGATIVE
Resp Syncytial Virus by PCR: NEGATIVE
SARS Coronavirus 2 by RT PCR: NEGATIVE

## 2023-12-24 LAB — CBC WITH DIFFERENTIAL/PLATELET
Abs Immature Granulocytes: 0.05 10*3/uL (ref 0.00–0.07)
Basophils Absolute: 0.1 10*3/uL (ref 0.0–0.1)
Basophils Relative: 1 %
Eosinophils Absolute: 0.1 10*3/uL (ref 0.0–0.5)
Eosinophils Relative: 1 %
HCT: 40.4 % (ref 36.0–46.0)
Hemoglobin: 13.4 g/dL (ref 12.0–15.0)
Immature Granulocytes: 1 %
Lymphocytes Relative: 18 %
Lymphs Abs: 1.7 10*3/uL (ref 0.7–4.0)
MCH: 30.7 pg (ref 26.0–34.0)
MCHC: 33.2 g/dL (ref 30.0–36.0)
MCV: 92.4 fL (ref 80.0–100.0)
Monocytes Absolute: 0.8 10*3/uL (ref 0.1–1.0)
Monocytes Relative: 9 %
Neutro Abs: 6.8 10*3/uL (ref 1.7–7.7)
Neutrophils Relative %: 70 %
Platelets: 159 10*3/uL (ref 150–400)
RBC: 4.37 MIL/uL (ref 3.87–5.11)
RDW: 12.1 % (ref 11.5–15.5)
WBC: 9.5 10*3/uL (ref 4.0–10.5)
nRBC: 0 % (ref 0.0–0.2)

## 2023-12-24 LAB — COMPREHENSIVE METABOLIC PANEL WITH GFR
ALT: 21 U/L (ref 0–44)
AST: 46 U/L — ABNORMAL HIGH (ref 15–41)
Albumin: 3.7 g/dL (ref 3.5–5.0)
Alkaline Phosphatase: 61 U/L (ref 38–126)
Anion gap: 15 (ref 5–15)
BUN: 21 mg/dL (ref 8–23)
CO2: 19 mmol/L — ABNORMAL LOW (ref 22–32)
Calcium: 8.5 mg/dL — ABNORMAL LOW (ref 8.9–10.3)
Chloride: 101 mmol/L (ref 98–111)
Creatinine, Ser: 1.38 mg/dL — ABNORMAL HIGH (ref 0.44–1.00)
GFR, Estimated: 36 mL/min — ABNORMAL LOW (ref >60)
Glucose, Bld: 188 mg/dL — ABNORMAL HIGH (ref 70–99)
Potassium: 4.1 mmol/L (ref 3.5–5.1)
Sodium: 135 mmol/L (ref 135–145)
Total Bilirubin: 1.2 mg/dL (ref 0.0–1.2)
Total Protein: 7.4 g/dL (ref 6.5–8.1)

## 2023-12-24 LAB — GROUP A STREP BY PCR: Group A Strep by PCR: NOT DETECTED

## 2023-12-24 LAB — TROPONIN I (HIGH SENSITIVITY): Troponin I (High Sensitivity): 15 ng/L (ref <18)

## 2023-12-24 MED ORDER — IPRATROPIUM-ALBUTEROL 0.5-2.5 (3) MG/3ML IN SOLN: 3.0000 mL | Freq: Once | RESPIRATORY_TRACT | Status: DC

## 2023-12-24 MED ORDER — BENZONATATE 100 MG PO CAPS
200.0000 mg | ORAL_CAPSULE | Freq: Once | ORAL | Status: AC
  Administered 2023-12-24: 200 mg via ORAL
  Filled 2023-12-24: qty 2

## 2023-12-24 MED ORDER — BENZONATATE 100 MG PO CAPS: 100.0000 mg | ORAL_CAPSULE | Freq: Three times a day (TID) | ORAL | 0 refills | Status: DC | PRN

## 2023-12-24 NOTE — ED Triage Notes (Signed)
 Pt to ED via [facility] from home for having a syncope episode at home while on her bedside commode.  Pt has not been feeling well the past couple of days. When EMS arrived her bp was in the 80s. Last BP with EMS upon arrival was 132/90.

## 2023-12-24 NOTE — ED Provider Notes (Signed)
 [facility] Provider Note   Event Date/Time   First MD Initiated Contact with Patient 12/24/23 2022     (approximate) History  Loss of Consciousness  HPI Victoria Edwards is a 89 y.o. female with a stated past medical history of chronic disease, hypertension, and type 2 diabetes who presents via EMS after a episode of loss of consciousness while sitting on the toilet.  Patient states that she had an episode of diarrhea and lost consciousness slumping over forward in her chair.  Patient states that she woke up soon after this and was aware of her surroundings however was feeling very weak and could not get herself up off the toilet.  Patient complains of a cough, chills, and sore throat over the last 3 days that have been worsening since onset. ROS: Patient currently denies any vision changes, tinnitus, difficulty speaking, facial droop, sore throat, chest pain, shortness of breath, abdominal pain, nausea/vomiting/diarrhea, dysuria, or weakness/numbness/paresthesias in any extremity   Physical Exam  Triage Vital Signs: ED Triage Vitals  Encounter Vitals Group     BP      Systolic BP Percentile      Diastolic BP Percentile      Pulse      Resp      Temp      Temp src      SpO2      Weight      Height      Head Circumference      Peak Flow      Pain Score      Pain Loc      Pain Education      Exclude from Growth Chart    Most recent vital signs: Vitals:   12/24/23 2023 12/24/23 2243  BP: 139/62 134/64  Pulse: 79 69  Resp: 17 16  Temp: 98.2 F (36.8 C)   SpO2: 97% 97%   General: Awake, oriented x4. CV:  Good peripheral perfusion.  Resp:  Normal effort.  Abd:  No distention.  Other:  Elderly obese Caucasian female resting comfortably in no acute distress ED Results / Procedures / Treatments  Labs (all labs ordered are listed, but only abnormal results are displayed) Labs Reviewed  COMPREHENSIVE METABOLIC PANEL WITH GFR - Abnormal; Notable for the  following components:      Result Value   CO2 19 (*)    Glucose, Bld 188 (*)    Creatinine, Ser 1.38 (*)    Calcium 8.5 (*)    AST 46 (*)    GFR, Estimated 36 (*)    All other components within normal limits  RESP PANEL BY RT-PCR (RSV, FLU A&B, COVID)  [id number]  GROUP A STREP BY PCR  CBC WITH DIFFERENTIAL/PLATELET  URINALYSIS, ROUTINE W REFLEX MICROSCOPIC  TROPONIN I (HIGH SENSITIVITY)  TROPONIN I (HIGH SENSITIVITY)   EKG ED ECG REPORT I, Charleen Conn, the attending physician, personally viewed and interpreted this ECG. Date: 12/24/2023 EKG Time: 2025 Rate: 79 Rhythm: normal sinus rhythm QRS Axis: normal Intervals: normal ST/T Wave abnormalities: normal Narrative Interpretation: no evidence of acute ischemia RADIOLOGY ED MD interpretation: One-view portable chest x-ray interpreted by me shows no evidence of acute abnormalities including no pneumonia, pneumothorax, or widened mediastinum -Agree with radiology assessment Official radiology report(s): DG Chest Port 1 View Result Date: 12/24/2023 CLINICAL DATA:  Cough, dyspnea, syncope EXAM: PORTABLE CHEST 1 VIEW COMPARISON:  08/27/2016 FINDINGS: The heart size and mediastinal contours are within normal limits. Both  lungs are clear. The visualized skeletal structures are unremarkable. IMPRESSION: No active disease. Electronically Signed   By: Worthy Heads M.D.   On: 12/24/2023 22:24   PROCEDURES: Critical Care performed: No .1-3 Lead EKG Interpretation  Performed by: Charleen Conn, MD Authorized by: Charleen Conn, MD     Interpretation: normal     ECG rate:  71   ECG rate assessment: normal     Rhythm: sinus rhythm     Ectopy: none     Conduction: normal    MEDICATIONS ORDERED IN ED: Medications  benzonatate (TESSALON) capsule 200 mg (has no administration in time range)   IMPRESSION / MDM / ASSESSMENT AND PLAN / ED COURSE  I reviewed the triage vital signs and the nursing notes.                              The patient is on the cardiac monitor to evaluate for evidence of arrhythmia and/or significant heart rate changes. Patient's presentation is most consistent with acute presentation with potential threat to life or bodily function. Patient presents with complaints of syncope/presyncope ED Workup:  CBC, BMP, Troponin, ECG, CXR Differential diagnosis includes HF, ICH, seizure, stroke, HOCM, ACS, aortic dissection, malignant arrhythmia, or GI bleed. Findings: No evidence of acute laboratory abnormalities.  Troponin negative x1 EKG: No e/o STEMI. No evidence of Brugada's sign, delta wave, epsilon wave, significantly prolonged QTc, or malignant arrhythmia.  Disposition: Discharge. Patient is at baseline at this time. Return precautions expressed and understood in person. Advised follow up with primary care provider or clinic physician in next 24 hours.   FINAL CLINICAL IMPRESSION(S) / ED DIAGNOSES   Final diagnoses:  Syncope, unspecified syncope type  Acute cough  Sore throat   Rx / DC Orders   ED Discharge Orders          Ordered    benzonatate (TESSALON PERLES) 100 MG capsule  3 times daily PRN        12/24/23 2252           Note:  This document was prepared using Dragon voice recognition software and may include unintentional dictation errors.   Marjorie Deprey K, MD 12/24/23 [phone]

## 2024-01-09 ENCOUNTER — Ambulatory Visit: Admitting: Dermatology

## 2024-01-27 NOTE — Progress Notes (Signed)
 Chief Complaint  Patient presents with  . Follow-up  . annual wellness visit     HPI  Victoria Edwards is a 89 y.o. here for a Follow up and a Medicare Wellness visit  C/o Feeling light headed - worse when she stands up C/o Redness and itching of left eye from Ectropion- Declines surgery Had an episode of syncope in April 2025  Left knee and hip pain has improved Has leg weakness and sometimes Knees buckle  Has lost weight  Has been using a walker with wheels Hx of  Spinal stenosis and Neurogenic claudication Has been using a TENS machine for neck pain- Has had some relief  Low back pain has resolved  Has difficulty in falling asleep  Denies chest pains or shortness of breath Recent labs;  Hgb; 12.6 Sugar;154 ,A1c;8.1, BUN: 28 Se Creat; 1.2,  (e GFR; 42)TSH ;2.088 Total cholesterol;109  and Triglycerides;155 Rest of 10 point review of systems is normal        Outpatient Encounter Medications as of 01/27/2024  Medication Sig Dispense Refill  . acetaminophen  (TYLENOL  EXTRA STRENGTH ORAL) Take by mouth    . allopurinoL  (ZYLOPRIM ) 100 MG tablet Take 1 tablet (100 mg total) by mouth once daily 100 tablet 2  . aspirin  81 MG chewable tablet Take 81 mg by mouth once daily.    . blood glucose diagnostic (ONETOUCH ULTRA TEST) test strip once daily Use as instructed. 200 strip 2  . blood glucose diagnostic test strip once daily 100 each 3  . blood glucose meter kit as directed for up to 180 days 1 each 0  . cholecalciferol  (VITAMIN D3) 2,000 unit tablet Take 1 tablet (2,000 Units total) by mouth once daily. 14 tablet 3  . diazePAM  (VALIUM ) 5 MG tablet TAKE ONE-HALF TABLET BY MOUTH EVERY NIGHT AT BEDTIME AS NEEDED. 30 tablet 1  . dicyclomine (BENTYL) 10 mg capsule Take 1 capsule (10 mg total) by mouth 2 (two) times daily as needed 120 capsule 3  . glipiZIDE  (GLUCOTROL  XL) 5 MG XL tablet Take 1 tablet (5 mg total) by mouth once daily 100 tablet 3  . glipiZIDE  (GLUCOTROL ) 2.5 MG XL tablet TAKE 1  TABLET BY MOUTH ONCE  DAILY 100 tablet 2  . ipratropium (ATROVENT) 21 mcg (0.03 %) nasal spray USE 2 SPRAYS IN BOTH NOSTRILS  TWICE DAILY 90 mL 2  . levothyroxine  (SYNTHROID ) 50 MCG tablet TAKE 1 TABLET BY MOUTH DAILY ON  AN EMPTY STOMACH WITH A FULL  GLASS OF WATER AT LEAST 1/2 HOUR TO 1 HOUR BEFORE BREAKFAST 100 tablet 2  . losartan  (COZAAR ) 50 MG tablet Take 1 tablet (50 mg total) by mouth once daily for 180 days 90 tablet 1  . metoprolol  TARTrate (LOPRESSOR ) 50 MG tablet TAKE ONE-HALF TABLET BY MOUTH 3  TIMES DAILY 150 tablet 2  . niacin (NIASPAN) 500 MG ER tablet TAKE 1 TABLET BY MOUTH AT BEDTIME 30 tablet 3  . omega-3 fatty acids-vitamin E 1,000 mg Take 1 capsule by mouth 3 (three) times daily.    SABRA omeprazole (PRILOSEC) 20 MG DR capsule Take 1 capsule (20 mg total) by mouth once daily 100 capsule 2  . propylene glycol (SYSTANE BALANCE) 0.6 % ophthalmic drops Place 1 drop into both eyes as needed for Dry Eyes    . RED YEAST RICE ORAL Take by mouth once daily.      SABRA lancing device with lancets kit Use 1 each 2 (two) times daily 200 each 3  No facility-administered encounter medications on file as of 01/27/2024.    Allergies as of 01/27/2024 - Reviewed 09/28/2023  Allergen Reaction Noted  . Ace inhibitors Other (See Comments) 04/29/2014  . Avelox [moxifloxacin] Other (See Comments) 04/29/2014  . Bactrim  [sulfamethoxazole -trimethoprim ] Unknown 04/29/2014  . Demerol [meperidine] Unknown 04/29/2014  . Penicillins Unknown 04/29/2014  . Statins-hmg-coa reductase inhibitors Unknown 04/29/2014  . Tricor [fenofibrate nanocrystallized] Unknown 04/29/2014    Past Medical History:  Diagnosis Date  . Allergic rhinitis   . Anxiety   . CAD (coronary artery disease)   . Cataract cortical, senile   . Chicken pox   . Chronic renal insufficiency   . Depression    she has a hx of a nervous breakdown years ago  . Diabetes mellitus type 2, uncomplicated (CMS/HHS-HCC)   . GERD (gastroesophageal  reflux disease)   . Gout   . Hypertension   . Kidney stones   . Spinal stenosis   . Thyroid disease    Hypothyroidism    Past Surgical History:  Procedure Laterality Date  . APPENDECTOMY    . CATARACT EXTRACTION Bilateral   . CHOLECYSTECTOMY    . Kidney stone crushed    . TONSILLECTOMY AND ADENOIDECTOMY      Blood pressure 126/80, pulse 68, height 162.6 cm (5' 4), weight 79.9 kg (176 lb 3.2 oz), SpO2 98%.    Exam  Body mass index is 30.24 kg/m. Wt Readings from Last 3 Encounters:  01/27/24 79.9 kg (176 lb 3.2 oz)  09/28/23 82.1 kg (181 lb)  05/06/23 84.4 kg (186 lb)    Overweight General. NAD-VS reviewed     Eyes. Sclera and conjunctiva clear; Vision grossly intact; extraocular movements intact Left lower eye lid  ectropion with tearing noted  EAC's and TM: Normal  Oropharynx. No suspicious lesions Neck. Supple. No swelling, masses, thyroid normal size, no masses palpated.   Lungs. Respirations unlabored; clear to auscultation bilaterally Cardiovascular. Heart regular rate and rhythm without murmurs, gallops, or rubs Abdomen: Soft - Non tender  No masses felt. PELVIC: Declined  Extremities; OA Rt  knee with swelling and tenderness Tenderness +  Neurologic. Alert and oriented x3; CN 2-12 grossly intact;Walks with a cane  no focal deficits  Assessment and Plan:   1 Type 2 DM: On Glipizide  7.5  mg po daily A1c is 8.1 Declines Metformin or Januvia. Discussed diet and exercise Rec Annual eye exams  2 Gout; No recent flare ups -On Allopurinol  100 milligrams po daily Monitor  3 HTN: On  Metoprolol  to 50 mg po twice a day and Losartan  Has been feeling light headed Decrease Losartan  to 25 mg po qd  Monitor BP closely  4 Mixed Hyperlipidemia   On Fish oil and red yeast rice Unable to tolerate statins or Zetia  On  Niaspan  5 Depression and anxiety:Doing well. No longer on Zoloft   7 Hypothyroidism: On  Levothyroxine  to 50 mcg po  Daily TSH is ok (2.088)   Continue to monitor  8 Chronic Kidney disease:(Stage 3) Se Creat is  1.2- Monitor  E GFR: 42 Avoid Nephrotoxic agents Monitor  9 Low Vit D; Continue supplements  10 Left eye ectropion; Trial of Pred forte eye drops Declines surgery  11 Health maintenance;Colonoscopy-2010- 2 polyps-ascending colon. Up to date with Flu shot,and Pneumovax-2010. Declines COVID vaccine Mammogram -ok-2019- Declines repeat  Discussed results of labs  Prescription sent for Rollator walker  Continue efforts at weight reduction - Discussed intermittent fasting  Check cbc,met-c, lipids ua  and A1c 1 week prior to next visit  Follow up in 4 months      Tamra Leventhal  MD  Zailee Vallely is a 89 y.o. here for Medicare Wellness Visit  MEDICARE WELLNESS VISIT  Providers Rendering Care 1. Dr. Tamra Hande(PCP)  Functional Assessment (1) Hearing: Demonstrates no difficulty in hearing during normal conversation (2) Risk of Falls: Patient denies any falls or near falls in the last year, Gait is unsteady without assistance during walk from waiting area to exam room Uses a walker  (3) Home Safety: Patient feels secure in their home, There are operational smoke alarms in multiple areas of the home (4) Activities of Daily Living: Independently manages personal grooming and household chores, including cooking, cleaning and laundry. Manages Personal finances without assistance.  Depression Screening PHQ 2/9 last 3 flowsheet values     12/29/2022    5:02 PM 09/28/2023   11:07 AM 01/27/2024    9:58 AM  PHQ-2/9 Depression Screening   Little interest or pleasure in doing things 1 0 0  Feeling down, depressed, or hopeless  0 0  Patient Health Questionnaire-2 Score  0 0     Depression Severity and Treatment Recommendations:  0-4= None  5-9= Mild / Treatment: Support, educate to call if worse; return in one month  10-14= Moderate / Treatment: Support, watchful waiting; Antidepressant or Psychotherapy   15-19= Moderately severe / Treatment: Antidepressant OR Psychotherapy  >= 20 = Major depression, severe / Antidepressant AND Psychotherapy No symptoms of Depression   Cognitive Impairment Patient denies episodes of loosing things, being forgetful. Seems oriented to person, place and time.  Responses appear appropriate and timely to this observer.  PREVENTION PLAN  Cardiovascular: FLP assessed; 01/20/24; LDL; 45 Diabetes: A1c or FBG assessed; 01/20/24; A1c; 8.1 Glaucoma: N/A Hepatitis B (HBV) Vaccine:  Not Applicable Smoking Cessation:  Not Applicable  Other Personalized Health Advice  Encouraged patient to exercise 5 days a week, walking, water aerobics, gentle stretching recommended. Increase dietary intake of fresh fruits and vegetables, reduce red meat to twice a week.  End of Life Counseling Patient has living will in place; Has designated her grand daughter  as her POA - ; Full Code  Current Outpatient Medications  Medication Sig Dispense Refill  . acetaminophen  (TYLENOL  EXTRA STRENGTH ORAL) Take by mouth    . allopurinoL  (ZYLOPRIM ) 100 MG tablet Take 1 tablet (100 mg total) by mouth once daily 100 tablet 2  . aspirin  81 MG chewable tablet Take 81 mg by mouth once daily.    . blood glucose diagnostic (ONETOUCH ULTRA TEST) test strip once daily Use as instructed. 200 strip 2  . blood glucose diagnostic test strip once daily 100 each 3  . blood glucose meter kit as directed for up to 180 days 1 each 0  . cholecalciferol  (VITAMIN D3) 2,000 unit tablet Take 1 tablet (2,000 Units total) by mouth once daily. 14 tablet 3  . diazePAM  (VALIUM ) 5 MG tablet TAKE ONE-HALF TABLET BY MOUTH EVERY NIGHT AT BEDTIME AS NEEDED. 30 tablet 1  . dicyclomine (BENTYL) 10 mg capsule Take 1 capsule (10 mg total) by mouth 2 (two) times daily as needed 120 capsule 3  . glipiZIDE  (GLUCOTROL  XL) 5 MG XL tablet Take 1 tablet (5 mg total) by mouth once daily 100 tablet 3  . glipiZIDE  (GLUCOTROL ) 2.5 MG XL  tablet TAKE 1 TABLET BY MOUTH ONCE  DAILY 100 tablet 2  . ipratropium (ATROVENT) 21 mcg (0.03 %) nasal spray  USE 2 SPRAYS IN BOTH NOSTRILS  TWICE DAILY 90 mL 2  . levothyroxine  (SYNTHROID ) 50 MCG tablet TAKE 1 TABLET BY MOUTH DAILY ON  AN EMPTY STOMACH WITH A FULL  GLASS OF WATER AT LEAST 1/2 HOUR TO 1 HOUR BEFORE BREAKFAST 100 tablet 2  . losartan  (COZAAR ) 50 MG tablet Take 1 tablet (50 mg total) by mouth once daily for 180 days 90 tablet 1  . metoprolol  TARTrate (LOPRESSOR ) 50 MG tablet TAKE ONE-HALF TABLET BY MOUTH 3  TIMES DAILY 150 tablet 2  . niacin (NIASPAN) 500 MG ER tablet TAKE 1 TABLET BY MOUTH AT BEDTIME 30 tablet 3  . omega-3 fatty acids-vitamin E 1,000 mg Take 1 capsule by mouth 3 (three) times daily.    SABRA omeprazole (PRILOSEC) 20 MG DR capsule Take 1 capsule (20 mg total) by mouth once daily 100 capsule 2  . propylene glycol (SYSTANE BALANCE) 0.6 % ophthalmic drops Place 1 drop into both eyes as needed for Dry Eyes    . RED YEAST RICE ORAL Take by mouth once daily.      SABRA lancing device with lancets kit Use 1 each 2 (two) times daily 200 each 3   No current facility-administered medications for this visit.    Allergies as of 01/27/2024 - Reviewed 09/28/2023  Allergen Reaction Noted  . Ace inhibitors Other (See Comments) 04/29/2014  . Avelox [moxifloxacin] Other (See Comments) 04/29/2014  . Bactrim  [sulfamethoxazole -trimethoprim ] Unknown 04/29/2014  . Demerol [meperidine] Unknown 04/29/2014  . Penicillins Unknown 04/29/2014  . Statins-hmg-coa reductase inhibitors Unknown 04/29/2014  . Tricor [fenofibrate nanocrystallized] Unknown 04/29/2014    Patient Active Problem List  Diagnosis  . Hypertension  . Depression  . Thyroid disease  . Anxiety  . Type 2 diabetes mellitus with stage 3b chronic kidney disease, without long-term current use of insulin  (CMS/HHS-HCC)  . Stage 3 chronic kidney disease (CMS/HHS-HCC)  . Hyperlipidemia, mixed  . CKD (chronic kidney disease) stage  3, GFR 30-59 ml/min (CMS/HHS-HCC)  . Chronic midline low back pain with right-sided sciatica  . Ambulatory dysfunction  . Spinal stenosis of lumbar region    Past Medical History:  Diagnosis Date  . Allergic rhinitis   . Anxiety   . CAD (coronary artery disease)   . Cataract cortical, senile   . Chicken pox   . Chronic renal insufficiency   . Depression    she has a hx of a nervous breakdown years ago  . Diabetes mellitus type 2, uncomplicated (CMS/HHS-HCC)   . GERD (gastroesophageal reflux disease)   . Gout   . Hypertension   . Kidney stones   . Spinal stenosis   . Thyroid disease    Hypothyroidism    Past Surgical History:  Procedure Laterality Date  . APPENDECTOMY    . CATARACT EXTRACTION Bilateral   . CHOLECYSTECTOMY    . Kidney stone crushed    . TONSILLECTOMY AND ADENOIDECTOMY      Health Maintenance  Topic Date Due  . Monofilament Foot Exam  12/22/2023  . Diabetes Education  12/22/2023  . Medicare Subsequent AWV [id number]  12/30/2023  . RSV Immunization Pregnant or 60+ (1 - 1-dose 75+ series) 09/27/2024 (Originally 05/29/2006)  . Diabetes Eye Assessment Exam  02/23/2024  . Hemoglobin A1C  04/21/2024  . Annual Physical/Well Child Check  05/06/2024  . Creatinine Level  01/19/2025  . Potassium Level  01/19/2025  . TSH Level  01/19/2025  . Lipid Panel  01/19/2025  . Annual Urine Albumin Creatinine Ratio  01/19/2025  . Serum Bicarbonate  01/19/2025  . Serum Phosphorus  01/19/2025  . Parathyroid Hormone  01/19/2025  . Serum Calcium   01/19/2025  . Depression Screening  01/26/2025  . Pneumococcal Vaccine: 50+  Completed  . Shingrix  Completed  . Influenza Vaccine  Completed  . Hib Vaccines  Aged Out  . Hepatitis A Vaccines  Aged Out  . Meningococcal B Vaccine  Aged Out  . Meningococcal ACWY Vaccine  Aged Out  . HPV Vaccines  Aged Out  . COVID-19 Vaccine  Discontinued  . Mammogram  Discontinued  . DXA Bone Density Scan  Discontinued  . Adult Tetanus (Td And  Tdap)  Discontinued    Vitals:   01/27/24 0957  BP: 126/80  Pulse: 68  SpO2: 98%  Weight: 79.9 kg (176 lb 3.2 oz)  Height: 162.6 cm (5' 4)  PainSc: 0-No pain   Body mass index is 30.24 kg/m.  Assessment/Plan  1. Medicare wellness visit- Medications and allergies reviewed. Copy of preventative health provided.  Labs reviewed.   TAMRA ETHELENE LEVENTHAL, MD

## 2024-04-29 ENCOUNTER — Other Ambulatory Visit: Payer: Self-pay

## 2024-04-29 ENCOUNTER — Observation Stay
Admission: EM | Admit: 2024-04-29 | Discharge: 2024-05-02 | Disposition: A | Discharge status: Skilled Nursing Facility | Attending: Internal Medicine | Admitting: Internal Medicine

## 2024-04-29 ENCOUNTER — Emergency Department

## 2024-04-29 DIAGNOSIS — Z7984 Long term (current) use of oral hypoglycemic drugs: Secondary | ICD-10-CM | POA: Diagnosis not present

## 2024-04-29 DIAGNOSIS — L8990 Pressure ulcer of unspecified site, unspecified stage: Secondary | ICD-10-CM | POA: Insufficient documentation

## 2024-04-29 DIAGNOSIS — E039 Hypothyroidism, unspecified: Secondary | ICD-10-CM | POA: Diagnosis not present

## 2024-04-29 DIAGNOSIS — E1122 Type 2 diabetes mellitus with diabetic chronic kidney disease: Secondary | ICD-10-CM | POA: Insufficient documentation

## 2024-04-29 DIAGNOSIS — N1832 Chronic kidney disease, stage 3b: Secondary | ICD-10-CM | POA: Insufficient documentation

## 2024-04-29 DIAGNOSIS — L89152 Pressure ulcer of sacral region, stage 2: Secondary | ICD-10-CM | POA: Diagnosis not present

## 2024-04-29 DIAGNOSIS — W19XXXA Unspecified fall, initial encounter: Principal | ICD-10-CM | POA: Insufficient documentation

## 2024-04-29 DIAGNOSIS — E1129 Type 2 diabetes mellitus with other diabetic kidney complication: Secondary | ICD-10-CM

## 2024-04-29 DIAGNOSIS — S82892A Other fracture of left lower leg, initial encounter for closed fracture: Secondary | ICD-10-CM | POA: Diagnosis present

## 2024-04-29 DIAGNOSIS — E785 Hyperlipidemia, unspecified: Secondary | ICD-10-CM | POA: Diagnosis not present

## 2024-04-29 DIAGNOSIS — I10 Essential (primary) hypertension: Secondary | ICD-10-CM | POA: Diagnosis present

## 2024-04-29 DIAGNOSIS — N390 Urinary tract infection, site not specified: Secondary | ICD-10-CM | POA: Diagnosis present

## 2024-04-29 DIAGNOSIS — Z7982 Long term (current) use of aspirin: Secondary | ICD-10-CM | POA: Insufficient documentation

## 2024-04-29 DIAGNOSIS — S82842A Displaced bimalleolar fracture of left lower leg, initial encounter for closed fracture: Secondary | ICD-10-CM | POA: Diagnosis not present

## 2024-04-29 DIAGNOSIS — M109 Gout, unspecified: Secondary | ICD-10-CM | POA: Insufficient documentation

## 2024-04-29 DIAGNOSIS — Z79899 Other long term (current) drug therapy: Secondary | ICD-10-CM | POA: Insufficient documentation

## 2024-04-29 DIAGNOSIS — N3000 Acute cystitis without hematuria: Secondary | ICD-10-CM

## 2024-04-29 DIAGNOSIS — E119 Type 2 diabetes mellitus without complications: Secondary | ICD-10-CM | POA: Insufficient documentation

## 2024-04-29 DIAGNOSIS — D696 Thrombocytopenia, unspecified: Secondary | ICD-10-CM | POA: Insufficient documentation

## 2024-04-29 DIAGNOSIS — I129 Hypertensive chronic kidney disease with stage 1 through stage 4 chronic kidney disease, or unspecified chronic kidney disease: Secondary | ICD-10-CM | POA: Insufficient documentation

## 2024-04-29 DIAGNOSIS — E559 Vitamin D deficiency, unspecified: Secondary | ICD-10-CM | POA: Insufficient documentation

## 2024-04-29 HISTORY — DX: Unspecified osteoarthritis, unspecified site: M19.90

## 2024-04-29 HISTORY — DX: Hyperlipidemia, unspecified: E78.5

## 2024-04-29 HISTORY — DX: Type 2 diabetes mellitus without complications: E11.9

## 2024-04-29 LAB — URINALYSIS, W/ REFLEX TO CULTURE (INFECTION SUSPECTED)
Bacteria, UA: NONE SEEN
Bilirubin Urine: NEGATIVE
Glucose, UA: NEGATIVE mg/dL
Hgb urine dipstick: NEGATIVE
Ketones, ur: NEGATIVE mg/dL
Nitrite: NEGATIVE
Protein, ur: NEGATIVE mg/dL
Specific Gravity, Urine: 1.018 (ref 1.005–1.030)
WBC, UA: >50 WBC/hpf (ref 0–5)
pH: 5 (ref 5.0–8.0)

## 2024-04-29 LAB — COMPREHENSIVE METABOLIC PANEL WITH GFR
ALT: 18 U/L (ref 0–44)
AST: 32 U/L (ref 15–41)
Albumin: 3.3 g/dL — ABNORMAL LOW (ref 3.5–5.0)
Alkaline Phosphatase: 48 U/L (ref 38–126)
Anion gap: 10 (ref 5–15)
BUN: 31 mg/dL — ABNORMAL HIGH (ref 8–23)
CO2: 21 mmol/L — ABNORMAL LOW (ref 22–32)
Calcium: 8.7 mg/dL — ABNORMAL LOW (ref 8.9–10.3)
Chloride: 103 mmol/L (ref 98–111)
Creatinine, Ser: 1.27 mg/dL — ABNORMAL HIGH (ref 0.44–1.00)
GFR, Estimated: 40 mL/min — ABNORMAL LOW (ref >60)
Glucose, Bld: 189 mg/dL — ABNORMAL HIGH (ref 70–99)
Potassium: 4.2 mmol/L (ref 3.5–5.1)
Sodium: 134 mmol/L — ABNORMAL LOW (ref 135–145)
Total Bilirubin: 0.7 mg/dL (ref 0.0–1.2)
Total Protein: 6.9 g/dL (ref 6.5–8.1)

## 2024-04-29 LAB — GLUCOSE, CAPILLARY
Glucose-Capillary: 138 mg/dL — ABNORMAL HIGH (ref 70–99)
Glucose-Capillary: 144 mg/dL — ABNORMAL HIGH (ref 70–99)

## 2024-04-29 LAB — TROPONIN I (HIGH SENSITIVITY)
Troponin I (High Sensitivity): 18 ng/L — ABNORMAL HIGH (ref <18)
Troponin I (High Sensitivity): 17 ng/L (ref <18)

## 2024-04-29 LAB — CBC
HCT: 35.2 % — ABNORMAL LOW (ref 36.0–46.0)
Hemoglobin: 11.7 g/dL — ABNORMAL LOW (ref 12.0–15.0)
MCH: 30.1 pg (ref 26.0–34.0)
MCHC: 33.2 g/dL (ref 30.0–36.0)
MCV: 90.5 fL (ref 80.0–100.0)
Platelets: 123 K/uL — ABNORMAL LOW (ref 150–400)
RBC: 3.89 MIL/uL (ref 3.87–5.11)
RDW: 12.3 % (ref 11.5–15.5)
WBC: 5.9 K/uL (ref 4.0–10.5)
nRBC: 0 % (ref 0.0–0.2)

## 2024-04-29 LAB — BRAIN NATRIURETIC PEPTIDE: B Natriuretic Peptide: 98.8 pg/mL (ref 0.0–100.0)

## 2024-04-29 MED ORDER — PANTOPRAZOLE SODIUM 40 MG IV SOLR
40.0000 mg | Freq: Two times a day (BID) | INTRAVENOUS | Status: DC
  Administered 2024-04-29 – 2024-04-30 (×2): 40 mg via INTRAVENOUS
  Filled 2024-04-29 (×2): qty 10

## 2024-04-29 MED ORDER — ONDANSETRON HCL 4 MG/2ML IJ SOLN: 4.0000 mg | Freq: Four times a day (QID) | INTRAMUSCULAR | Status: DC | PRN

## 2024-04-29 MED ORDER — DIAZEPAM 5 MG PO TABS
2.5000 mg | ORAL_TABLET | Freq: Every evening | ORAL | Status: DC | PRN
  Administered 2024-04-29 – 2024-05-01 (×3): 2.5 mg via ORAL
  Filled 2024-04-29 (×3): qty 1

## 2024-04-29 MED ORDER — LEVOTHYROXINE SODIUM 50 MCG PO TABS: 25.0000 ug | ORAL_TABLET | Freq: Every day | ORAL | Status: DC

## 2024-04-29 MED ORDER — POLYETHYLENE GLYCOL 3350 17 G PO PACK: 17.0000 g | PACK | Freq: Every day | ORAL | Status: DC | PRN

## 2024-04-29 MED ORDER — ACETAMINOPHEN 650 MG RE SUPP: 650.0000 mg | Freq: Four times a day (QID) | RECTAL | Status: DC | PRN

## 2024-04-29 MED ORDER — ADULT MULTIVITAMIN W/MINERALS CH
1.0000 | ORAL_TABLET | Freq: Every day | ORAL | Status: DC
  Administered 2024-04-30 – 2024-05-02 (×3): 1 via ORAL
  Filled 2024-04-29 (×3): qty 1

## 2024-04-29 MED ORDER — INSULIN ASPART 100 UNIT/ML IJ SOLN: 0.0000 [IU] | Freq: Every day | INTRAMUSCULAR | Status: DC

## 2024-04-29 MED ORDER — ALLOPURINOL 300 MG PO TABS
150.0000 mg | ORAL_TABLET | Freq: Every day | ORAL | Status: DC
  Administered 2024-04-30 – 2024-05-02 (×3): 150 mg via ORAL
  Filled 2024-04-29 (×3): qty 1

## 2024-04-29 MED ORDER — ACETAMINOPHEN 325 MG PO TABS
650.0000 mg | ORAL_TABLET | Freq: Four times a day (QID) | ORAL | Status: DC | PRN
  Administered 2024-04-30 – 2024-05-02 (×3): 650 mg via ORAL
  Filled 2024-04-29 (×3): qty 2

## 2024-04-29 MED ORDER — LEVOTHYROXINE SODIUM 50 MCG PO TABS
50.0000 ug | ORAL_TABLET | Freq: Every day | ORAL | Status: DC
  Administered 2024-04-30 – 2024-05-02 (×3): 50 ug via ORAL
  Filled 2024-04-29 (×3): qty 1

## 2024-04-29 MED ORDER — MECLIZINE HCL 25 MG PO TABS: 25.0000 mg | ORAL_TABLET | Freq: Three times a day (TID) | ORAL | Status: DC | PRN

## 2024-04-29 MED ORDER — SODIUM CHLORIDE 0.9 % IV SOLN
2.0000 g | Freq: Once | INTRAVENOUS | Status: AC
  Administered 2024-04-29: 2 g via INTRAVENOUS
  Filled 2024-04-29: qty 20

## 2024-04-29 MED ORDER — VITAMIN D 25 MCG (1000 UNIT) PO TABS
2000.0000 [IU] | ORAL_TABLET | Freq: Two times a day (BID) | ORAL | Status: DC
  Administered 2024-04-29 – 2024-05-01 (×4): 2000 [IU] via ORAL
  Filled 2024-04-29 (×4): qty 2

## 2024-04-29 MED ORDER — METOPROLOL TARTRATE 50 MG PO TABS: 50.0000 mg | ORAL_TABLET | Freq: Two times a day (BID) | ORAL | Status: DC

## 2024-04-29 MED ORDER — INSULIN ASPART 100 UNIT/ML IJ SOLN
0.0000 [IU] | Freq: Three times a day (TID) | INTRAMUSCULAR | Status: DC
  Administered 2024-04-30: 3 [IU] via SUBCUTANEOUS
  Administered 2024-05-01 (×2): 1 [IU] via SUBCUTANEOUS
  Filled 2024-04-29 (×3): qty 1

## 2024-04-29 MED ORDER — DOCUSATE SODIUM 100 MG PO CAPS
100.0000 mg | ORAL_CAPSULE | Freq: Two times a day (BID) | ORAL | Status: DC
  Administered 2024-04-29 – 2024-05-02 (×6): 100 mg via ORAL
  Filled 2024-04-29 (×6): qty 1

## 2024-04-29 MED ORDER — ONDANSETRON HCL 4 MG PO TABS: 4.0000 mg | ORAL_TABLET | Freq: Four times a day (QID) | ORAL | Status: DC | PRN

## 2024-04-29 MED ORDER — SODIUM CHLORIDE 0.9 % IV SOLN
1.0000 g | INTRAVENOUS | Status: DC
  Filled 2024-04-29: qty 10

## 2024-04-29 MED ORDER — MORPHINE SULFATE (PF) 2 MG/ML IV SOLN: 2.0000 mg | INTRAVENOUS | Status: DC | PRN

## 2024-04-29 MED ORDER — ENOXAPARIN SODIUM 40 MG/0.4ML IJ SOSY
40.0000 mg | PREFILLED_SYRINGE | INTRAMUSCULAR | Status: DC
  Administered 2024-04-29 – 2024-05-01 (×3): 40 mg via SUBCUTANEOUS
  Filled 2024-04-29 (×3): qty 0.4

## 2024-04-29 MED ORDER — METOPROLOL TARTRATE 25 MG PO TABS
25.0000 mg | ORAL_TABLET | Freq: Three times a day (TID) | ORAL | Status: DC
  Administered 2024-04-29 – 2024-04-30 (×2): 25 mg via ORAL
  Filled 2024-04-29 (×2): qty 1

## 2024-04-29 MED ORDER — ASPIRIN 81 MG PO TBEC
81.0000 mg | DELAYED_RELEASE_TABLET | Freq: Every day | ORAL | Status: DC
  Administered 2024-04-30 – 2024-05-02 (×3): 81 mg via ORAL
  Filled 2024-04-29 (×3): qty 1

## 2024-04-29 NOTE — ED Triage Notes (Signed)
 Pt to ED from home [facility] for L ankle pain--had a fall 2 days ago--became dizzy and sat down and sat down awkwardly on her ankle on the floor.  Pain with WB since yesterday.  Hx arthritis, joints pop a lot  BG has been higher than normal this past week, 210-240 when usually around 150.  EMS VS: CBG 212, 96% RA, 141/68.   50mcg fentanyl given en route. No IV, EMS stuck 3 times  Per EMS, also pitting edema to both LE and smells like some sort of infx.   Pt is alert and oriented. Pain is 0/10 currently. Pt states when she fell, unsure if legs got weak or she passed out.

## 2024-04-29 NOTE — H&P (Signed)
 History and Physical    [name] FMW:[id number] DOB: 14-Mar-1929 DOA: 04/29/2024  DOS: the patient was seen and examined on 04/29/2024  PCP: Sadie Manna, MD   Patient coming from: Home  I have personally briefly reviewed patient's old medical records in [facility]  Chief Complaint: Fall at home 2 days ago  HPI: Victoria Edwards is a pleasant 89 y.o. female with medical history significant for diabetes, gout, arthritis, HTN, HLD who came into ED by EMS for left ankle pain after a fall 2 days ago.  Patient stated that she was dizzy and sat down and landed on her left ankle on the floor.  She denied hitting the head and witnessed by the family.  Patient denies any new weakness, numbness tingling or cold extremity.  She denies any headache, chest pain, shortness of breath, visual changes, no back pain.  Patient has some intermittent dysuria.  EMS gave her 50 mcg of fentanyl and route, blood glucose was 212, vital signs were stable.  ED Course: Upon arrival to the ED, patient is found to be hypertensive at 160/67, heart rate was 58 x-ray of the lower extremity showed medial and lateral malleolar fracture on the left side.  Patient was found to have positive urine analysis for UTI.  Podiatry team was called and they will come and see the patient.  Hospitalist service was consulted for evaluation for admission for fall, UTI, closed fracture of the left ankle.  Review of Systems:  ROS  All other systems negative except as noted in the HPI.  Past Medical History:  Diagnosis Date   Arthritis    Diabetes mellitus without complication (HCC)    Gout    Hyperlipidemia    Hypertension    Kidney calculus    Kidney stones     Past Surgical History:  Procedure Laterality Date   APPENDECTOMY     CHOLECYSTECTOMY     TONSILLECTOMY       reports that she has never smoked. She has never used smokeless tobacco. She reports that she does not drink alcohol and does not use  drugs.  Allergies  Allergen Reactions   Demerol [Meperidine] Other (See Comments)    Reaction:  Confusion    Shellfish Allergy    Statins Other (See Comments)   Penicillins Itching, Rash and Other (See Comments)    Has patient had a PCN reaction causing immediate rash, facial/tongue/throat swelling, SOB or lightheadedness with hypotension: No Has patient had a PCN reaction causing severe rash involving mucus membranes or skin necrosis: No Has patient had a PCN reaction that required hospitalization No Has patient had a PCN reaction occurring within the last 10 years: No If all of the above answers are NO, then may proceed with Cephalosporin use.    Family History  Problem Relation Age of Onset   Breast cancer Neg Hx     Prior to Admission medications   Medication Sig Start Date End Date Taking? Authorizing Provider  allopurinol  (ZYLOPRIM ) 300 MG tablet Take 150 mg by mouth daily.    [provider]  aspirin  EC 81 MG tablet Take 81 mg by mouth daily.    [provider]  benzonatate  (TESSALON  PERLES) 100 MG capsule Take 1 capsule (100 mg total) by mouth 3 (three) times daily as needed for cough. 12/24/23 12/23/24  Bradler, Evan K, MD  Cholecalciferol  (VITAMIN D3) 2000 UNITS TABS Take 2,000 Units by mouth 2 (two) times daily.    [provider]  diazepam  (VALIUM ) 5 MG tablet Take 2.5 mg by mouth at bedtime as needed (for sleep).    [provider]  glipiZIDE  (GLUCOTROL  XL) 5 MG 24 hr tablet Take 5 mg by mouth daily.    [provider]  ipratropium (ATROVENT) 0.03 % nasal spray Place 2 sprays into both nostrils 2 (two) times daily.    [provider]  ketoconazole (NIZORAL) 2 % shampoo Apply 1 application topically every 14 (fourteen) days.    [provider]  levothyroxine  (SYNTHROID , LEVOTHROID) 25 MCG tablet Take 25 mcg by mouth daily before breakfast.    [provider]  meclizine  (ANTIVERT ) 25 MG tablet Take 1  tablet (25 mg total) by mouth 3 (three) times daily as needed for dizziness. 07/15/15   Yvonna Alm Cough, MD  metoprolol  (LOPRESSOR ) 50 MG tablet Take 50 mg by mouth 2 (two) times daily.    [provider]  mometasone (ELOCON) 0.1 % lotion Apply 1 application topically daily as needed (for itching on ears).    [provider]  Multiple Vitamin (MULTIVITAMIN WITH MINERALS) TABS tablet Take 1 tablet by mouth daily.    [provider]  Omega-3 Fatty Acids (FISH OIL) 1000 MG CAPS Take 1,000 mg by mouth 3 (three) times daily.    [provider]  omeprazole (PRILOSEC) 20 MG capsule Take 20 mg by mouth daily as needed.    [provider]  Red Yeast Rice 600 MG CAPS Take 600 mg by mouth daily.    [provider]  sertraline  (ZOLOFT ) 25 MG tablet Take 25 mg by mouth daily.    [provider]    Physical Exam: Vitals:   04/29/24 1200 04/29/24 1230 04/29/24 1300 04/29/24 1330  BP: (!) 143/75 (!) 157/79 (!) 149/59 (!) 160/67  Pulse: 62 62 (!) 57 (!) 58  Resp: 15 13 16 19   Temp:      TempSrc:      SpO2: 100% 100% 100% 100%  Weight:      Height:        Physical Exam   Constitutional: Alert, awake, calm, comfortable HEENT: Neck supple Respiratory: Clear to auscultation B/L, no wheezing, no rales.  Cardiovascular: Regular rate and rhythm, no murmurs / rubs / gallops. No extremity edema. 2+ pedal pulses. No carotid bruits.  Abdomen: Soft, no tenderness, Bowel sounds positive.  Musculoskeletal: Left ankle pain and tenderness, both lower extremity have chronic skin changes Skin: no rashes, lesions, ulcers. Neurologic: CN 2-12 grossly intact. Sensation intact, No focal deficit identified Psychiatric: Alert and oriented x 3. Normal mood.    Labs on Admission: I have personally reviewed following labs and imaging studies  CBC: Recent Labs  Lab 04/29/24 1157  WBC 5.9  HGB 11.7*  HCT 35.2*  MCV 90.5  PLT 123*   Basic  Metabolic Panel: Recent Labs  Lab 04/29/24 1157  NA 134*  K 4.2  CL 103  CO2 21*  GLUCOSE 189*  BUN 31*  CREATININE 1.27*  CALCIUM  8.7*   GFR: Estimated Creatinine Clearance: 31.3 mL/min (A) (by C-G formula based on SCr of 1.27 mg/dL (H)). Liver Function Tests: Recent Labs  Lab 04/29/24 1157  AST 32  ALT 18  ALKPHOS 48  BILITOT 0.7  PROT 6.9  ALBUMIN 3.3*   No results for input(s): LIPASE, AMYLASE in the last 168 hours. No results for input(s): AMMONIA in the last 168 hours. Coagulation Profile: No results for input(s): INR, PROTIME in the last 168  hours. Cardiac Enzymes: Recent Labs  Lab 04/29/24 1157  TROPONINIHS 18*   BNP (last 3 results) Recent Labs    04/29/24 1157  BNP 98.8   HbA1C: No results for input(s): HGBA1C in the last 72 hours. CBG: No results for input(s): GLUCAP in the last 168 hours. Lipid Profile: No results for input(s): CHOL, HDL, LDLCALC, TRIG, CHOLHDL, LDLDIRECT in the last 72 hours. Thyroid Function Tests: No results for input(s): TSH, T4TOTAL, FREET4, T3FREE, THYROIDAB in the last 72 hours. Anemia Panel: No results for input(s): VITAMINB12, FOLATE, FERRITIN, TIBC, IRON, RETICCTPCT in the last 72 hours. Urine analysis:    Component Value Date/Time   COLORURINE YELLOW (A) 04/29/2024 1157   APPEARANCEUR HAZY (A) 04/29/2024 1157   APPEARANCEUR Clear 02/20/2013 2122   LABSPEC 1.018 04/29/2024 1157   LABSPEC 1.009 02/20/2013 2122   PHURINE 5.0 04/29/2024 1157   GLUCOSEU NEGATIVE 04/29/2024 1157   GLUCOSEU Negative 02/20/2013 2122   HGBUR NEGATIVE 04/29/2024 1157   BILIRUBINUR NEGATIVE 04/29/2024 1157   BILIRUBINUR Negative 02/20/2013 2122   KETONESUR NEGATIVE 04/29/2024 1157   PROTEINUR NEGATIVE 04/29/2024 1157   NITRITE NEGATIVE 04/29/2024 1157   LEUKOCYTESUR LARGE (A) 04/29/2024 1157   LEUKOCYTESUR Trace 02/20/2013 2122    Radiological Exams on Admission: I have personally  reviewed images DG Tibia/Fibula Left Result Date: 04/29/2024 CLINICAL DATA:  Dizziness, fall, pain. EXAM: LEFT TIBIA AND FIBULA - 2 VIEW COMPARISON:  Left ankle same day. FINDINGS: Medial and lateral malleolar fractures, as on dedicated views of the left ankle done the same day. Tibia fibular otherwise intact. IMPRESSION: Medial and lateral malleolar fractures, better seen and described on dedicated views of the left ankle done the same day. No additional fracture. Electronically Signed   By: Newell Eke M.D.   On: 04/29/2024 13:26   DG Knee Complete 4 Views Left Result Date: 04/29/2024 CLINICAL DATA:  Fall, dizziness, pain. EXAM: LEFT KNEE - COMPLETE 4+ VIEW COMPARISON:  None Available. FINDINGS: No acute osseous or joint abnormality. Trace patellofemoral osteophytosis. Lateral joint space narrowing. Slight varus angulation of the knee. Chondrocalcinosis in the medial compartment. IMPRESSION: Degenerative changes in the knee.  No acute findings. Electronically Signed   By: Newell Eke M.D.   On: 04/29/2024 13:25   DG Chest 2 View Result Date: 04/29/2024 CLINICAL DATA:  Fall.  Dizziness. EXAM: CHEST - 2 VIEW COMPARISON:  12/24/2023. FINDINGS: Trachea is midline. Heart size stable. Thoracic aorta is calcified. Lungs are clear. No pleural fluid. Degenerative changes in the spine. IMPRESSION: No acute findings. Electronically Signed   By: Newell Eke M.D.   On: 04/29/2024 13:23   DG Ankle Complete Left Result Date: 04/29/2024 CLINICAL DATA:  Fall, pain. EXAM: LEFT ANKLE COMPLETE - 3+ VIEW COMPARISON:  None Available. FINDINGS: There is a minimally displaced obliquely oriented fracture of the distal fibular metadiaphysis. Minimally displaced transversely oriented fracture of the medial malleolus. Ankle mortise is grossly intact. Diffuse soft tissue swelling. IMPRESSION: Medial and lateral malleolar fractures. Electronically Signed   By: Newell Eke M.D.   On: 04/29/2024 13:22    EKG: My  personal interpretation of EKG shows: Sinus rhythm at 63 bpm    Assessment/Plan Principal Problem:   UTI (urinary tract infection) Active Problems:   Closed left ankle fracture   DM (diabetes mellitus) (HCC)   HTN (hypertension)   Gout   HLD (hyperlipidemia)    Assessment and Plan: 89 year old female W/PMH of DM, H LD, HTN, gout who came into ED after a  fall and landing on left ankle with left ankle fracture.  1.  Left medial and lateral malleolar fracture - Pain medication with Tylenol  and morphine  - Podiatry consulted by ED - DVT prophylaxis - Further plan per podiatry team  2.  Urinary tract infection without signs of sepsis - Received ceftriaxone  in the ED - Will continue ceftriaxone  at this point and follow cultures  3.  Diabetes type 2 - Hold oral hypoglycemic drugs while she is in the hospital - Continue insulin  sliding scale as patient was not using insulin  at home - Continue to monitor blood sugar  4.  HTN - Continue metoprolol  - Continue to monitor blood pressure  5.  HLD - Patient does not take any statin but she takes red yeast rice  6.  GERD Will add Protonix  she takes omeprazole at home  7.  Hypothyroidism Continue Synthroid  25 mcg    DVT prophylaxis: Lovenox  Code Status: Full Code Family Communication: Family was present Disposition Plan: Home versus rehab Consults called: Podiatry Admission status: Observation, Med-Surg   Nena Rebel, MD [facility] 04/29/2024, 2:21 PM

## 2024-04-29 NOTE — Consult Note (Addendum)
 Reason for Consult: Left ankle fracture Referring Physician: Dr. Lorelle Chantal KATHEE Victoria Edwards is an 89 y.o. female.  HPI: Patient has a history of type 2 diabetes last A1c 8.1% on 01/20/2024, suffered a standing to ground mechanical fall due to dizziness 2 days ago without any other associated trauma so she turned onto her ankle and landed on it.  Had worsening ankle pain with weightbearing today and was brought to ER, found to have UTI and bimalleolar ankle fracture were consulted for management of the fracture.  Past Medical History:  Diagnosis Date   Arthritis    Diabetes mellitus without complication (HCC)    Gout    Hyperlipidemia    Hypertension    Kidney calculus    Kidney stones     Past Surgical History:  Procedure Laterality Date   APPENDECTOMY     CHOLECYSTECTOMY     TONSILLECTOMY      Family History  Problem Relation Age of Onset   Breast cancer Neg Hx     Social History:  reports that she has never smoked. She has never used smokeless tobacco. She reports that she does not drink alcohol and does not use drugs.  Allergies:  Allergies  Allergen Reactions   Demerol [Meperidine] Other (See Comments)    Reaction:  Confusion    Shellfish Allergy    Statins Other (See Comments)   Penicillins Itching, Rash and Other (See Comments)    Has patient had a PCN reaction causing immediate rash, facial/tongue/throat swelling, SOB or lightheadedness with hypotension: No Has patient had a PCN reaction causing severe rash involving mucus membranes or skin necrosis: No Has patient had a PCN reaction that required hospitalization No Has patient had a PCN reaction occurring within the last 10 years: No If all of the above answers are NO, then may proceed with Cephalosporin use.    Medications: I have reviewed the patient's current medications.  Results for orders placed or performed during the hospital encounter of 04/29/24 (from the past 48 hours)  Comprehensive metabolic panel      Status: Abnormal   Collection Time: 04/29/24 11:57 AM  Result Value Ref Range   Sodium 134 (L) 135 - 145 mmol/L   Potassium 4.2 3.5 - 5.1 mmol/L   Chloride 103 98 - 111 mmol/L   CO2 21 (L) 22 - 32 mmol/L   Glucose, Bld 189 (H) 70 - 99 mg/dL    Comment: Glucose reference range applies only to samples taken after fasting for at least 8 hours.   BUN 31 (H) 8 - 23 mg/dL   Creatinine, Ser 8.72 (H) 0.44 - 1.00 mg/dL   Calcium  8.7 (L) 8.9 - 10.3 mg/dL   Total Protein 6.9 6.5 - 8.1 g/dL   Albumin 3.3 (L) 3.5 - 5.0 g/dL   AST 32 15 - 41 U/L   ALT 18 0 - 44 U/L   Alkaline Phosphatase 48 38 - 126 U/L   Total Bilirubin 0.7 0.0 - 1.2 mg/dL   GFR, Estimated 40 (L) >60 mL/min    Comment: (NOTE) Calculated using the CKD-EPI Creatinine Equation (2021)    Anion gap 10 5 - 15    Comment: Performed at [facility], [address] [place], [place] [address]  CBC     Status: Abnormal   Collection Time: 04/29/24 11:57 AM  Result Value Ref Range   WBC 5.9 4.0 - 10.5 K/uL   RBC 3.89 3.87 - 5.11 MIL/uL   Hemoglobin 11.7 (  L) 12.0 - 15.0 g/dL   HCT 64.7 (L) 63.9 - 53.9 %   MCV 90.5 80.0 - 100.0 fL   MCH 30.1 26.0 - 34.0 pg   MCHC 33.2 30.0 - 36.0 g/dL   RDW 87.6 88.4 - 84.4 %   Platelets 123 (L) 150 - 400 K/uL   nRBC 0.0 0.0 - 0.2 %    Comment: Performed at [facility], [address], [place], [place] [address]  Troponin I (High Sensitivity)     Status: Abnormal   Collection Time: 04/29/24 11:57 AM  Result Value Ref Range   Troponin I (High Sensitivity) 18 (H) <18 ng/L    Comment: (NOTE) Elevated high sensitivity troponin I (hsTnI) values and significant  changes across serial measurements may suggest ACS but many other  chronic and acute conditions are known to elevate hsTnI results.  Refer to the Links section for chest pain algorithms and additional  guidance. Performed at [facility], [address] [place], [place] [address]   Brain natriuretic  peptide     Status: None   Collection Time: 04/29/24 11:57 AM  Result Value Ref Range   B Natriuretic Peptide 98.8 0.0 - 100.0 pg/mL    Comment: Performed at [facility], [address] [place], [place] [address]  Urinalysis, w/ Reflex to Culture (Infection Suspected) -Urine, Clean Catch     Status: Abnormal   Collection Time: 04/29/24 11:57 AM  Result Value Ref Range   Specimen Source URINE, CLEAN CATCH    Color, Urine YELLOW (A) YELLOW   APPearance HAZY (A) CLEAR   Specific Gravity, Urine 1.018 1.005 - 1.030   pH 5.0 5.0 - 8.0   Glucose, UA NEGATIVE NEGATIVE mg/dL   Hgb urine dipstick NEGATIVE NEGATIVE   Bilirubin Urine NEGATIVE NEGATIVE   Ketones, ur NEGATIVE NEGATIVE mg/dL   Protein, ur NEGATIVE NEGATIVE mg/dL   Nitrite NEGATIVE NEGATIVE   Leukocytes,Ua LARGE (A) NEGATIVE   RBC / HPF 0-5 0 - 5 RBC/hpf   WBC, UA >50 0 - 5 WBC/hpf    Comment:        Reflex urine culture not performed if WBC <=10, OR if Squamous epithelial cells >5. If Squamous epithelial cells >5 suggest recollection.    Bacteria, UA NONE SEEN NONE SEEN   Squamous Epithelial / HPF 6-10 0 - 5 /HPF   Mucus PRESENT    Non Squamous Epithelial PRESENT (A) NONE SEEN    Comment: Performed at [facility], [address] [place], [place] [address]  Troponin I (High Sensitivity)     Status: None   Collection Time: 04/29/24  2:05 PM  Result Value Ref Range   Troponin I (High Sensitivity) 17 <18 ng/L    Comment: (NOTE) Elevated high sensitivity troponin I (hsTnI) values and significant  changes across serial measurements may suggest ACS but many other  chronic and acute conditions are known to elevate hsTnI results.  Refer to the Links section for chest pain algorithms and additional  guidance. Performed at [facility], [address] [place], [place] [address]     DG Tibia/Fibula Left Result Date: 04/29/2024 CLINICAL DATA:  Dizziness, fall, pain. EXAM: LEFT TIBIA AND  FIBULA - 2 VIEW COMPARISON:  Left ankle same day. FINDINGS: Medial and lateral malleolar fractures, as on dedicated views of the left ankle done the same day. Tibia fibular otherwise intact. IMPRESSION: Medial and lateral malleolar fractures, better seen and described on dedicated views of the left  ankle done the same day. No additional fracture. Electronically Signed   By: Newell Eke M.D.   On: 04/29/2024 13:26   DG Knee Complete 4 Views Left Result Date: 04/29/2024 CLINICAL DATA:  Fall, dizziness, pain. EXAM: LEFT KNEE - COMPLETE 4+ VIEW COMPARISON:  None Available. FINDINGS: No acute osseous or joint abnormality. Trace patellofemoral osteophytosis. Lateral joint space narrowing. Slight varus angulation of the knee. Chondrocalcinosis in the medial compartment. IMPRESSION: Degenerative changes in the knee.  No acute findings. Electronically Signed   By: Newell Eke M.D.   On: 04/29/2024 13:25   DG Chest 2 View Result Date: 04/29/2024 CLINICAL DATA:  Fall.  Dizziness. EXAM: CHEST - 2 VIEW COMPARISON:  12/24/2023. FINDINGS: Trachea is midline. Heart size stable. Thoracic aorta is calcified. Lungs are clear. No pleural fluid. Degenerative changes in the spine. IMPRESSION: No acute findings. Electronically Signed   By: Newell Eke M.D.   On: 04/29/2024 13:23   DG Ankle Complete Left Result Date: 04/29/2024 CLINICAL DATA:  Fall, pain. EXAM: LEFT ANKLE COMPLETE - 3+ VIEW COMPARISON:  None Available. FINDINGS: There is a minimally displaced obliquely oriented fracture of the distal fibular metadiaphysis. Minimally displaced transversely oriented fracture of the medial malleolus. Ankle mortise is grossly intact. Diffuse soft tissue swelling. IMPRESSION: Medial and lateral malleolar fractures. Electronically Signed   By: Newell Eke M.D.   On: 04/29/2024 13:22    Review of Systems  Constitutional:  Negative for chills and fever.  Cardiovascular:  Negative for chest pain.  Gastrointestinal:   Negative for nausea and vomiting.  Genitourinary:  Positive for dysuria.  Musculoskeletal:  Positive for joint pain.  Neurological:  Positive for dizziness.  All other systems reviewed and are negative.  Blood pressure (!) 160/67, pulse (!) 58, temperature 98.2 F (36.8 C), temperature source Oral, resp. rate 19, height 5' 6 (1.676 m), weight 86.2 kg, SpO2 100%.  Vitals:   04/29/24 1300 04/29/24 1330  BP: (!) 149/59 (!) 160/67  Pulse: (!) 57 (!) 58  Resp: 16 19  Temp:    SpO2: 100% 100%    General AA&O x3. Normal mood and affect.  Vascular Left foot is warm well-perfused with good capillary fill time to digits wrapped in splint, per ER attending no fracture blisters or skin tenting  Neurologic Epicritic sensation grossly reduced.  Dermatologic (Wound) No open skin lesions.  Mycotic nails  Orthopedic: Motor intact BLE.    Assessment/Plan:  Left bimalleolar fracture -Imaging: Studies independently reviewed.  Agree with radiology report -WB Status: Nonweightbearing left lower extremity -Surgical Plan: I discussed with patient and her family members present today that she has a nondisplaced bimalleolar ankle fracture.  Due to the unstable nature of this I do recommend ORIF.  She has concerns about anesthesia and the risks of surgery.  We discussed the risk and benefits.  I discussed with her that intramedullary fibular fixation and screw fixation of the medial malleolus may offer increased ability that she will be able to ambulate sooner on the limb approximately 1 month after surgery as opposed to at least 2 months nonweightbearing with closed nonoperative treatment.  We discussed risk of the surgery self including the risk of infection nonhealing neurovascular compromise and bone healing problems as well as the risks of anesthesia.  Would likely need general anesthesia unless she is a candidate for spinal.  She is going to consider her options and let me know how she would like to  proceed. -I expect either way she will  need short-term rehab or SNF at discharge.  She lives at home and has a walker and wheelchair but they have concerns about being able to maintain full nonweightbearing with her.  PT has been ordered. Rogerio of care per hospitalist  Juliene JONELLE Medicine 04/29/2024, 2:59 PM   Best available via secure chat for questions or concerns.

## 2024-04-29 NOTE — ED Notes (Signed)
 ED tech on way to do L ankle splint then to bring pt to the floor.

## 2024-04-29 NOTE — Consult Note (Signed)
 WOC Nurse Consult Note: Reason for Consult: sacral wound Incidentally noted patient has irritant contact dermatitis under her bilateral breast Wound type: Stage 2 Pressure Injury: sacrum Irritant contact dermatitis (ICD) inframammary  Pressure Injury POA: Yes Measurement:see nursing flow sheets Wound azi:djrmlf pink, moist, clean Inframammary; pink, fungal appearing Drainage (amount, consistency, odor) see nurisng flow sheets Periwound: intact Dressing procedure/placement/frequency: Cleanse under the breast with soap and water, DC use of any creams or powders Order Gerlean # [id number] Measure and cut length of InterDry to fit in skin folds that have skin breakdown Tuck InterDry fabric into skin folds in a single layer, allow for 2 inches of overhang from skin edges to allow for wicking to occur May remove to bathe; dry area thoroughly and then tuck into affected areas again Do not apply any creams or ointments when using InterDry DO NOT THROW AWAY FOR 5 DAYS unless soiled with stool DO NOT [facility] product, this will inactivate the silver in the material  New sheet of Interdry should be applied after 5 days of use if patient continues to have skin breakdown    Cleanse sacral wound with saline, pat dry Cover open wound with single layer of xeroform and top with foam. Change every other day  Re consult if needed, will not follow at this time. Thanks  Halah Whiteside [employer], RN,CWOCN, CNS, [employer] [phone]

## 2024-04-29 NOTE — ED Provider Notes (Addendum)
 Victoria Edwards Provider Note    Event Date/Time   First MD Initiated Contact with Patient 04/29/24 1135     (approximate)   History   Ankle Injury and Fall   HPI  Victoria Edwards is a 89 y.o. female with history of diabetes, hypertension, hyperlipidemia, presenting with left ankle pain.  Patient states that 2 days ago, she felt lightheaded, her legs gave out and she fell onto her butt.  Did not hit her head, was witnessed by her family members.  Has noticed tenderness to her ankle on the left side as well as her left leg whenever she ambulates.  Denies any new weakness or numbness.  Daughter states that she has been having some congestion and a cough.  No fevers at home, she has had some chills.  She denies any chest pain or shortness of breath, no headache or vision changes, no back pain.  No abdominal pain, no nausea vomiting or diarrhea.  Did have some dysuria.  Per independent history from EMS, she was given 50 mcg of fentanyl en route, blood glucose was 212, vital signs are stable.  On independent review, she is seen by primary care in May, has history of lightheadedness when she stands up, has history of leg weakness and her knees sometimes buckle.  Has been using a walker with wheels.     Physical Exam   Triage Vital Signs: ED Triage Vitals  Encounter Vitals Group     BP 04/29/24 1144 (!) 168/70     Girls Systolic BP Percentile --      Girls Diastolic BP Percentile --      Boys Systolic BP Percentile --      Boys Diastolic BP Percentile --      Pulse Rate 04/29/24 1144 62     Resp 04/29/24 1144 20     Temp 04/29/24 1145 98.2 F (36.8 C)     Temp Source 04/29/24 1144 Oral     SpO2 04/29/24 1144 100 %     Weight 04/29/24 1142 190 lb (86.2 kg)     Height 04/29/24 1142 5' 6 (1.676 m)     Head Circumference --      Peak Flow --      Pain Score 04/29/24 1145 0     Pain Loc --      Pain Education --      Exclude from Growth Chart --     Most  recent vital signs: Vitals:   04/29/24 1300 04/29/24 1330  BP: (!) 149/59 (!) 160/67  Pulse: (!) 57 (!) 58  Resp: 16 19  Temp:    SpO2: 100% 100%     General: Awake, no distress.  CV:  Good peripheral perfusion.  Resp:  Normal effort.  No thoracic cage tenderness Abd:  No distention.  Soft nontender Other:  No palpable skull deformities or tenderness, no midline spinal tenderness, she is no tenderness to her bilateral upper extremities or to her right lower extremity, she has equal DP pulses bilaterally, able to dorsi and plantarflex against resistance bilaterally, no sensory deficits.  Grip strength is equal bilaterally.  No facial droop or slurred speech.  She does have tenderness to her left medial ankle as well as tenderness to her tib-fib and medial knee.  Able to fully range her knee and hip to her left lower extremity, reduced range of motion to her left ankle due to pain.  No tenderness to her actual foot.  ED Results / Procedures / Treatments   Labs (all labs ordered are listed, but only abnormal results are displayed) Labs Reviewed  COMPREHENSIVE METABOLIC PANEL WITH GFR - Abnormal; Notable for the following components:      Result Value   Sodium 134 (*)    CO2 21 (*)    Glucose, Bld 189 (*)    BUN 31 (*)    Creatinine, Ser 1.27 (*)    Calcium  8.7 (*)    Albumin 3.3 (*)    GFR, Estimated 40 (*)    All other components within normal limits  CBC - Abnormal; Notable for the following components:   Hemoglobin 11.7 (*)    HCT 35.2 (*)    Platelets 123 (*)    All other components within normal limits  URINALYSIS, W/ REFLEX TO CULTURE (INFECTION SUSPECTED) - Abnormal; Notable for the following components:   Color, Urine YELLOW (*)    APPearance HAZY (*)    Leukocytes,Ua LARGE (*)    Non Squamous Epithelial PRESENT (*)    All other components within normal limits  TROPONIN I (HIGH SENSITIVITY) - Abnormal; Notable for the following components:   Troponin I (High  Sensitivity) 18 (*)    All other components within normal limits  BRAIN NATRIURETIC PEPTIDE  TROPONIN I (HIGH SENSITIVITY)     EKG  EKG shows, sinus at a heart rate of 63, normal QS, normal QTc, no obvious ischemic ST elevation, T wave version to septal leads, T wave flattening 2 3, aVF, V4 through V6, not significant change compared to prior   RADIOLOGY On my independent interpretation, ankle x-ray shows a bimalleolar fracture on the left.   PROCEDURES:  Critical Care performed: No  Procedures   MEDICATIONS ORDERED IN ED: Medications  cefTRIAXone  (ROCEPHIN ) 2 g in sodium chloride  0.9 % 100 mL IVPB (has no administration in time range)  enoxaparin  (LOVENOX ) injection 40 mg (has no administration in time range)  acetaminophen  (TYLENOL ) tablet 650 mg (has no administration in time range)    Or  acetaminophen  (TYLENOL ) suppository 650 mg (has no administration in time range)  polyethylene glycol (MIRALAX  / GLYCOLAX ) packet 17 g (has no administration in time range)  ondansetron  (ZOFRAN ) tablet 4 mg (has no administration in time range)    Or  ondansetron  (ZOFRAN ) injection 4 mg (has no administration in time range)  allopurinol  (ZYLOPRIM ) tablet 150 mg (has no administration in time range)  Vitamin D3 TABS 2,000 Units (has no administration in time range)  aspirin  EC tablet 81 mg (has no administration in time range)  diazepam  (VALIUM ) tablet 2.5 mg (has no administration in time range)  levothyroxine  (SYNTHROID ) tablet 25 mcg (has no administration in time range)  meclizine  (ANTIVERT ) tablet 25 mg (has no administration in time range)  metoprolol  tartrate (LOPRESSOR ) tablet 50 mg (has no administration in time range)  multivitamin with minerals tablet 1 tablet (has no administration in time range)  pantoprazole  (PROTONIX ) injection 40 mg (has no administration in time range)     IMPRESSION / MDM / ASSESSMENT AND PLAN / ED COURSE  I reviewed the triage vital signs and the  nursing notes.                              Differential diagnosis includes, but is not limited to, deconditioning, electrolyte derangements, atypical ACS, arrhythmia, UTI, pneumonia, viral illness, for the fall, will get x-rays of the ankle, tib-fib, knee to  make sure that there are no fractures or dislocation.  She denies any head strike, no indication for CT head at this time.  Will get labs, EKG, troponin, chest x-ray, respiratory viral panel, x-rays of her left lower extremity.  Patient's presentation is most consistent with acute presentation with potential threat to life or bodily function.  Independent interpretation of labs and imaging below.  Given left ankle fracture and UTI, she will need to be admitted for further management.  Shared decision making by patient and family and they are agreeable with this plan.  Started on antibiotics, page podiatry to discuss ankle fractures.  Still waiting to hear back from podiatry, will have her placed in a posterior and stirrup leg splint.  Consulted hospitalist for admission who is agreeable with plan for admission and will evaluate the patient.  she is admitted.  Consulted podiatry, clinical course as below.  Posterior stirrup stent was placed, her pain is controlled at this time, Refills intact, able to wiggle her toes.  The patient is on the cardiac monitor to evaluate for evidence of arrhythmia and/or significant heart rate changes.   Clinical Course as of 04/29/24 1348  Sun Apr 29, 2024  1244 Independent review of labs, creatinine is mildly elevated but is downtrending compared to prior, LFTs are not elevated, UA is consistent with UTI, no leukocytosis, troponin is mildly elevated, BNP is normal. [TT]  1245 On independent chart review, prior urine culture showed multiple species, will start her on IV ceftriaxone .  She has rash to penicillins but received IM ceftriaxone  in 2022. [TT]  1343 DG Tibia/Fibula Left IMPRESSION: Medial and lateral  malleolar fractures, better seen and described on dedicated views of the left ankle done the same day. No additional fracture.   [TT]  1343 DG Knee Complete 4 Views Left Degenerative changes in the knee.  No acute findings.  [TT]  1343 DG Chest 2 View No acute findings.  [TT]  1344 DG Ankle Complete Left Medial and lateral malleolar fractures.  [TT]  1346 Consulted podiatry who will evaluate the patient, agree with splinting. [TT]    Clinical Course User Index [TT] Waymond Lorelle Cummins, MD     FINAL CLINICAL IMPRESSION(S) / ED DIAGNOSES   Final diagnoses:  Fall, initial encounter  Urinary tract infection without hematuria, site unspecified  Closed fracture of left ankle, initial encounter     Rx / DC Orders   ED Discharge Orders     None        Note:  This document was prepared using Dragon voice recognition software and may include unintentional dictation errors.    Waymond Lorelle Cummins, MD 04/29/24 1348    Waymond Lorelle Cummins, MD 04/29/24 [phone]

## 2024-04-30 DIAGNOSIS — I10 Essential (primary) hypertension: Secondary | ICD-10-CM | POA: Diagnosis not present

## 2024-04-30 DIAGNOSIS — S82842A Displaced bimalleolar fracture of left lower leg, initial encounter for closed fracture: Secondary | ICD-10-CM | POA: Diagnosis not present

## 2024-04-30 DIAGNOSIS — L89152 Pressure ulcer of sacral region, stage 2: Secondary | ICD-10-CM | POA: Insufficient documentation

## 2024-04-30 DIAGNOSIS — E1121 Type 2 diabetes mellitus with diabetic nephropathy: Secondary | ICD-10-CM

## 2024-04-30 DIAGNOSIS — N3000 Acute cystitis without hematuria: Secondary | ICD-10-CM | POA: Diagnosis not present

## 2024-04-30 DIAGNOSIS — S82892S Other fracture of left lower leg, sequela: Secondary | ICD-10-CM

## 2024-04-30 DIAGNOSIS — S82892A Other fracture of left lower leg, initial encounter for closed fracture: Secondary | ICD-10-CM | POA: Diagnosis not present

## 2024-04-30 DIAGNOSIS — L8990 Pressure ulcer of unspecified site, unspecified stage: Secondary | ICD-10-CM | POA: Insufficient documentation

## 2024-04-30 DIAGNOSIS — E1129 Type 2 diabetes mellitus with other diabetic kidney complication: Secondary | ICD-10-CM

## 2024-04-30 DIAGNOSIS — D696 Thrombocytopenia, unspecified: Secondary | ICD-10-CM | POA: Insufficient documentation

## 2024-04-30 LAB — HEPATITIS C ANTIBODY: HCV Ab: NONREACTIVE

## 2024-04-30 LAB — CBC
HCT: 32.6 % — ABNORMAL LOW (ref 36.0–46.0)
Hemoglobin: 10.8 g/dL — ABNORMAL LOW (ref 12.0–15.0)
MCH: 29.4 pg (ref 26.0–34.0)
MCHC: 33.1 g/dL (ref 30.0–36.0)
MCV: 88.8 fL (ref 80.0–100.0)
Platelets: 125 K/uL — ABNORMAL LOW (ref 150–400)
RBC: 3.67 MIL/uL — ABNORMAL LOW (ref 3.87–5.11)
RDW: 12.2 % (ref 11.5–15.5)
WBC: 4.4 K/uL (ref 4.0–10.5)
nRBC: 0 % (ref 0.0–0.2)

## 2024-04-30 LAB — VITAMIN D 25 HYDROXY (VIT D DEFICIENCY, FRACTURES): Vit D, 25-Hydroxy: 24.22 ng/mL — ABNORMAL LOW (ref 30–100)

## 2024-04-30 LAB — BASIC METABOLIC PANEL WITH GFR
Anion gap: 11 (ref 5–15)
BUN: 28 mg/dL — ABNORMAL HIGH (ref 8–23)
CO2: 22 mmol/L (ref 22–32)
Calcium: 8.2 mg/dL — ABNORMAL LOW (ref 8.9–10.3)
Chloride: 102 mmol/L (ref 98–111)
Creatinine, Ser: 1.22 mg/dL — ABNORMAL HIGH (ref 0.44–1.00)
GFR, Estimated: 42 mL/min — ABNORMAL LOW (ref >60)
Glucose, Bld: 119 mg/dL — ABNORMAL HIGH (ref 70–99)
Potassium: 3.8 mmol/L (ref 3.5–5.1)
Sodium: 135 mmol/L (ref 135–145)

## 2024-04-30 LAB — GLUCOSE, CAPILLARY
Glucose-Capillary: 124 mg/dL — ABNORMAL HIGH (ref 70–99)
Glucose-Capillary: 204 mg/dL — ABNORMAL HIGH (ref 70–99)
Glucose-Capillary: 137 mg/dL — ABNORMAL HIGH (ref 70–99)
Glucose-Capillary: 121 mg/dL — ABNORMAL HIGH (ref 70–99)

## 2024-04-30 MED ORDER — MORPHINE SULFATE (PF) 2 MG/ML IV SOLN: 2.0000 mg | INTRAVENOUS | Status: DC | PRN

## 2024-04-30 MED ORDER — SERTRALINE HCL 50 MG PO TABS
25.0000 mg | ORAL_TABLET | Freq: Every day | ORAL | Status: DC
  Administered 2024-05-01 – 2024-05-02 (×2): 25 mg via ORAL
  Filled 2024-04-30 (×3): qty 1

## 2024-04-30 MED ORDER — GLIPIZIDE ER 2.5 MG PO TB24
2.5000 mg | ORAL_TABLET | Freq: Every day | ORAL | Status: DC
  Administered 2024-05-01 – 2024-05-02 (×2): 2.5 mg via ORAL
  Filled 2024-04-30 (×2): qty 1

## 2024-04-30 MED ORDER — PANTOPRAZOLE SODIUM 40 MG PO TBEC
40.0000 mg | DELAYED_RELEASE_TABLET | Freq: Two times a day (BID) | ORAL | Status: DC
  Administered 2024-04-30 – 2024-05-02 (×4): 40 mg via ORAL
  Filled 2024-04-30 (×4): qty 1

## 2024-04-30 MED ORDER — OXYCODONE HCL 5 MG PO TABS: 5.0000 mg | ORAL_TABLET | Freq: Four times a day (QID) | ORAL | Status: DC | PRN

## 2024-04-30 MED ORDER — CEPHALEXIN 250 MG PO CAPS
250.0000 mg | ORAL_CAPSULE | Freq: Four times a day (QID) | ORAL | Status: DC
  Administered 2024-04-30 – 2024-05-02 (×8): 250 mg via ORAL
  Filled 2024-04-30 (×11): qty 1

## 2024-04-30 MED ORDER — LOSARTAN POTASSIUM 25 MG PO TABS
12.5000 mg | ORAL_TABLET | Freq: Every day | ORAL | Status: DC
Start: 2024-04-30 — End: 2024-05-02
  Administered 2024-04-30 – 2024-05-02 (×3): 12.5 mg via ORAL
  Filled 2024-04-30 (×3): qty 1

## 2024-04-30 MED ORDER — METOPROLOL TARTRATE 25 MG PO TABS
12.5000 mg | ORAL_TABLET | Freq: Two times a day (BID) | ORAL | Status: DC
  Administered 2024-04-30 – 2024-05-01 (×2): 12.5 mg via ORAL
  Filled 2024-04-30 (×2): qty 1

## 2024-04-30 MED ORDER — CALCIUM CARBONATE 1250 (500 CA) MG PO TABS
1.0000 | ORAL_TABLET | Freq: Two times a day (BID) | ORAL | Status: DC
  Administered 2024-04-30 – 2024-05-02 (×4): 1250 mg via ORAL
  Filled 2024-04-30 (×5): qty 1

## 2024-04-30 NOTE — Assessment & Plan Note (Signed)
 Present on admission.  Appreciate wound care nurse consultation.

## 2024-04-30 NOTE — Progress Notes (Signed)
 Progress Note   Patient: Victoria Edwards FMW:[id number] DOB: 02-18-1929 DOA: 04/29/2024     0 DOS: the patient was seen and examined on 04/30/2024   Brief hospital course: 89 y.o. female with medical history significant for diabetes, gout, arthritis, HTN, HLD who came into ED by EMS for left ankle pain after a fall 2 days ago.  Patient stated that she was dizzy and sat down and landed on her left ankle on the floor.  She denied hitting the head and witnessed by the family.  Patient denies any new weakness, numbness tingling or cold extremity.  She denies any headache, chest pain, shortness of breath, visual changes, no back pain.  Patient has some intermittent dysuria.   EMS gave her 50 mcg of fentanyl and route, blood glucose was 212, vital signs were stable.   ED Course: Upon arrival to the ED, patient is found to be hypertensive at 160/67, heart rate was 58 x-ray of the lower extremity showed medial and lateral malleolar fracture on the left side.  Patient was found to have positive urine analysis for UTI.  Podiatry team was called and they will come and see the patient.  Hospitalist service was consulted for evaluation for admission for fall, UTI, closed fracture of the left ankle.  9/1.  Patient hesitant about undergoing an operation at her age.  Had a fall couple days ago and diagnosed with ankle fracture.  PT recommending rehab and she has nonweightbearing left ankle.  Assessment and Plan: * Closed left ankle fracture Patient hesitant about going to the operating room and she will likely decide on nonoperative management.  Pain control.  PT recommending rehab.  Acute cystitis without hematuria Not sure if this is a urinary tract infection or not.  Patient had quite a few squamous cells seen on the urine analysis.  Change Rocephin  over to Keflex .  Type 2 diabetes, controlled, with renal manifestation (HCC) Also with hyperglycemia.  Underlying chronic kidney disease stage IIIb.  Sliding scale  insulin .  Restart Glucotrol  XL tomorrow.  HTN (hypertension) Continue metoprolol  and restart losartan   Thrombocytopenia (HCC) Continue to monitor.  Add on hepatitis C antibody.  Sacral decubitus ulcer, stage II (HCC) Present on admission.  Appreciate wound care nurse consultation.  Gout Continue allopurinol         Subjective: Patient had a fall in the other day.  Unable to bear weight on her left ankle.  Found to have ankle fracture.  Physical therapy recommending rehab.  Patient likely to decline operation.  Physical Exam: Vitals:   04/29/24 1642 04/29/24 1925 04/30/24 0516 04/30/24 0745  BP: (!) 150/61 (!) 161/66 (!) 146/77 (!) 141/61  Pulse: 64 69 72 64  Resp: 18 18 18 16   Temp: 98.5 F (36.9 C) 98.2 F (36.8 C) 98.5 F (36.9 C) 97.6 F (36.4 C)  TempSrc: Oral Oral Oral   SpO2: 98% 98% 98% 96%  Weight:      Height:       Physical Exam HENT:     Head: Normocephalic.  Eyes:     General: Lids are normal.     Conjunctiva/sclera: Conjunctivae normal.  Cardiovascular:     Rate and Rhythm: Normal rate and regular rhythm.     Heart sounds: Normal heart sounds, S1 normal and S2 normal.  Pulmonary:     Breath sounds: No decreased breath sounds, wheezing, rhonchi or rales.  Abdominal:     Palpations: Abdomen is soft.     Tenderness: There is no  abdominal tenderness.  Musculoskeletal:     Comments: Left ankle in splint  Skin:    General: Skin is warm.     Findings: No rash.  Neurological:     Mental Status: She is alert and oriented to person, place, and time.     Data Reviewed: Creatinine 1.22 with a GFR 42 electrolytes normal range, white blood cell count 4.4, hemoglobin 10.8, platelet count 125  Family Communication: Family at bedside  Disposition: Status is: Observation Physical therapy recommending rehab.  Will need a rehab bed and insurance authorization  Planned Discharge Destination: Rehab    Time spent: 27 minutes  Author: Charlie Patterson,  MD 04/30/2024 12:10 PM  For on call review [email or web address].

## 2024-04-30 NOTE — Assessment & Plan Note (Addendum)
 Also with hyperglycemia.  Underlying chronic kidney disease stage IIIb.  Sliding scale insulin .  Restarted Glucotrol  XL.

## 2024-04-30 NOTE — Hospital Course (Signed)
 89 y.o. female with medical history significant for diabetes, gout, arthritis, HTN, HLD who came into ED by EMS for left ankle pain after a fall 2 days ago.  Patient stated that she was dizzy and sat down and landed on her left ankle on the floor.  She denied hitting the head and witnessed by the family.  Patient denies any new weakness, numbness tingling or cold extremity.  She denies any headache, chest pain, shortness of breath, visual changes, no back pain.  Patient has some intermittent dysuria.   EMS gave her 50 mcg of fentanyl and route, blood glucose was 212, vital signs were stable.   ED Course: Upon arrival to the ED, patient is found to be hypertensive at 160/67, heart rate was 58 x-ray of the lower extremity showed medial and lateral malleolar fracture on the left side.  Patient was found to have positive urine analysis for UTI.  Podiatry team was called and they will come and see the patient.  Hospitalist service was consulted for evaluation for admission for fall, UTI, closed fracture of the left ankle.  9/1.  Patient hesitant about undergoing an operation at her age.  Had a fall couple days ago and diagnosed with ankle fracture.  PT recommending rehab and she has nonweightbearing left ankle.  9/2: Remained hemodynamically stable.  Patient will remain nonweightbearing on left ankle especially left heel, should maintain pillow under the calf and need to have a close follow-up with podiatry in 1 to 2 weeks for new x-rays.  For concern of acute cystitis patient was given a course of Keflex , she will continue for next 2 days to complete the course.  Patient was also found to have low vitamin D  levels at 24.22, she was given a dose of 50,000 unit which was not continued and she can go back to her home dose of vitamin D  2000 ID units twice daily.  Patient was given pain medication to use as needed if simple Tylenol  does not help.  Please be mindful and avoid constipation and more falls while  taking opioids.  Patient will continue the rest of her home medications and need to have a close follow-up with her providers for further assistance.

## 2024-04-30 NOTE — Progress Notes (Signed)
  Subjective:  Patient ID: Victoria Edwards, female    DOB: May 09, 1929,  MRN: [id number]  Patient seen at bedside with family members present to discuss treatment options.  She feels well her pain is fairly well-managed.  She had therapy this morning which was difficult.  Negative for chest pain and shortness of breath Fever: no Night sweats: no Constitutional signs: no Review of all other systems is negative Objective:   Vitals:   04/30/24 0516 04/30/24 0745  BP: (!) 146/77 (!) 141/61  Pulse: 72 64  Resp: 18 16  Temp: 98.5 F (36.9 C) 97.6 F (36.4 C)  SpO2: 98% 96%   General AA&O x3. Normal mood and affect.  Vascular Digital fill time normal to toes  Neurologic Epicritic sensation grossly reduced to toes.  Dermatologic Splint clean dry intact without signs of weightbearing  Orthopedic: MMT 5/5 in dorsiflexion, plantarflexion, inversion, and eversion. Normal joint ROM without pain or crepitus.    Assessment & Plan:  Patient was evaluated and treated and all questions answered.  Bimalleolar ankle fracture left - Discussed again operative versus nonoperative treatment with the patient.  She states at this point in her life she does not want any surgery at all.  We discussed nonoperative treatment plan, I do think her fracture will be able to heal but will need a longer period of nonweightbearing as opposed to operative intervention likely 6 to 8 weeks in splint/boot/cast. -Current splint can remain intact until outpatient follow-up with me.  I will have the office call to follow-up with me in 2 weeks -Nonweightbearing to left lower extremity.  Expect she will need rehab placement -Her calcium  level is low at 8.7, vitamin D  is pending.  Should be discharged on appropriate supplementation to optimize these for fracture healing -We will sign off.  Please call if questions were answered.   Juliene JONELLE Medicine, DPM  Accessible via secure chat for questions or concerns.

## 2024-04-30 NOTE — Assessment & Plan Note (Signed)
 Patient hesitant about going to the operating room and she will likely decide on nonoperative management.  Pain control.  PT recommending rehab.

## 2024-04-30 NOTE — Assessment & Plan Note (Signed)
 Continue to monitor.  Hepatitis C negative

## 2024-04-30 NOTE — Evaluation (Signed)
 Physical Therapy Evaluation Patient Details Name: Victoria Edwards MRN: [id number] DOB: 01-06-1929 Today's Date: 04/30/2024  History of Present Illness  Victoria Edwards is a pleasant 89 y.o. female with medical history significant for diabetes, gout, arthritis, HTN, HLD who came into ED by EMS for left ankle pain after a fall 2 days ago.  Patient stated that she was dizzy and sat down and landed on her left ankle on the floor.  She denied hitting the head and witnessed by the family.   Clinical Impression  Pt admitted with above diagnosis. Pt currently with functional limitations due to the deficits listed below (see PT Problem List). Pt received upright in bed agreeable to PT. Reports PTA living with family that provides 24/7 supervision with ambulation in household with [phone]. Indep with ADL's. Family helps with IADL's.  To date, reviewed NWB status on LLE. Pt understanding. Able to exit bed with bed features and minA+1 for LLE and to scoot anterior to EOB. Requires bed elevated and maxA+1 to stand to RW with inability to maintain NWB on LLE standing ~10-15 sec before returning to sitting. Notable increase WOB from pt due to difficulty. Pt reliant on maxA+1 for x3 R lateral scoots towards [facility] with pt having poor ability to use RLE and BUE's to clear buttocks to assist with transfer. minA On LLE to return to supine with all needs in reach. Pt reports significant effort with activity today. Discussed falls risk concerns and need for heavy physical assist. Pt in agreement this can not be provided at home in current condition. Pt will benefit from skilled PT services <3 hours/day to address current functional deficits and falls risk to maximize return to PLOF.        If plan is discharge home, recommend the following: Two people to help with walking and/or transfers;A lot of help with bathing/dressing/bathroom;Assist for transportation;Help with stairs or ramp for entrance   Can travel by private vehicle    No    Equipment Recommendations Other (comment) (TBD by next venue of care)  Recommendations for Other Services       Functional Status Assessment Patient has had a recent decline in their functional status and demonstrates the ability to make significant improvements in function in a reasonable and predictable amount of time.     Precautions / Restrictions Precautions Precautions: Fall Recall of Precautions/Restrictions: Intact Restrictions Weight Bearing Restrictions Per Provider Order: Yes LLE Weight Bearing Per Provider Order: Non weight bearing      Mobility  Bed Mobility Overal bed mobility: Needs Assistance Bed Mobility: Supine to Sit     Supine to sit: HOB elevated, Used rails, Min assist     General bed mobility comments: VC's for sequencing UE's/LE's Patient Response: Cooperative  Transfers Overall transfer level: Needs assistance Equipment used: Rolling walker (2 wheels) Transfers: Sit to/from Stand, Bed to chair/wheelchair/BSC Sit to Stand: Max assist, From elevated surface          Lateral/Scoot Transfers: Max assist General transfer comment: VC's for hand placement. Unable to maintain NWB in standing on LLE. MaxA+1 for x3 R lateral scoots upright towards HOB.    Ambulation/Gait               General Gait Details: deferred due to inability to maintain NWB.  Stairs            Wheelchair Mobility     Tilt Bed Tilt Bed Patient Response: Cooperative  Modified Rankin (Stroke Patients Only)  Balance Overall balance assessment: Needs assistance Sitting-balance support: Bilateral upper extremity supported, Feet supported Sitting balance-Leahy Scale: Fair     Standing balance support: No upper extremity supported, During functional activity Standing balance-Leahy Scale: Poor Standing balance comment: Heavy BUE support on RW                             Pertinent Vitals/Pain Pain Assessment Pain Assessment:  Faces Faces Pain Scale: Hurts a little bit Pain Location: L ankle Pain Descriptors / Indicators: Aching, Discomfort, Grimacing Pain Intervention(s): Limited activity within patient's tolerance, Monitored during session, Repositioned    Home Living Family/patient expects to be discharged to:: Private residence Living Arrangements: Other relatives (grand daughter and her husband)   Type of Home: House Home Access: Ramped entrance       Home Layout: One level Home Equipment: Rollator (4 wheels);Toilet riser;Wheelchair - manual      Prior Function Prior Level of Function : Independent/Modified Independent             Mobility Comments: using 4WW in household. Supervision with gait around house from family. ADLs Comments: Indep     Extremity/Trunk Assessment   Upper Extremity Assessment Upper Extremity Assessment: Defer to OT evaluation    Lower Extremity Assessment Lower Extremity Assessment: Generalized weakness;LLE deficits/detail LLE Deficits / Details: Wrapped in cast/splint    Cervical / Trunk Assessment Cervical / Trunk Assessment: Normal  Communication   Communication Communication: No apparent difficulties    Cognition Arousal: Alert Behavior During Therapy: WFL for tasks assessed/performed   PT - Cognitive impairments: No apparent impairments                         Following commands: Intact       Cueing Cueing Techniques: Verbal cues     General Comments      Exercises Other Exercises Other Exercises: WB precautions, d/c recs, role of PT in acute setting   Assessment/Plan    PT Assessment Patient needs continued PT services  PT Problem List Decreased strength;Decreased activity tolerance;Decreased balance;Decreased mobility;Decreased safety awareness;Pain       PT Treatment Interventions DME instruction;Balance training;Gait training;Neuromuscular re-education;Functional mobility training;Stair training;Patient/family  education;Therapeutic activities;Therapeutic exercise;Wheelchair mobility training    PT Goals (Current goals can be found in the Care Plan section)  Acute Rehab PT Goals Patient Stated Goal: improve mobility PT Goal Formulation: With patient Time For Goal Achievement: 05/14/24 Potential to Achieve Goals: Good    Frequency Min 4X/week     Co-evaluation               AM-PAC PT 6 Clicks Mobility  Outcome Measure Help needed turning from your back to your side while in a flat bed without using bedrails?: A Lot Help needed moving from lying on your back to sitting on the side of a flat bed without using bedrails?: A Lot Help needed moving to and from a bed to a chair (including a wheelchair)?: Total Help needed standing up from a chair using your arms (e.g., wheelchair or bedside chair)?: A Lot Help needed to walk in hospital room?: Total Help needed climbing 3-5 steps with a railing? : Total 6 Click Score: 9    End of Session Equipment Utilized During Treatment: Gait belt Activity Tolerance: Patient limited by fatigue Patient left: in bed;with call bell/phone within reach;with bed alarm set Nurse Communication: Mobility status PT Visit Diagnosis: Other abnormalities of  gait and mobility (R26.89);Muscle weakness (generalized) (M62.81);Difficulty in walking, not elsewhere classified (R26.2);Pain Pain - part of body: Ankle and joints of foot    Time: 9043-8972 PT Time Calculation (min) (ACUTE ONLY): 31 min   Charges:   PT Evaluation $PT Eval Moderate Complexity: 1 Mod PT Treatments $Therapeutic Activity: 8-22 mins PT General Charges $$ ACUTE PT VISIT: 1 Visit         Dorina HERO. Fairly IV, PT, DPT Physical Therapist- [place]  [facility] 04/30/2024, 11:08 AM

## 2024-04-30 NOTE — Assessment & Plan Note (Signed)
 Continue allopurinol 

## 2024-04-30 NOTE — Care Management Obs Status (Signed)
 MEDICARE OBSERVATION STATUS NOTIFICATION   Patient Details  Name: BREELYN ICARD MRN: [id number] Date of Birth: 07-26-1929   Medicare Observation Status Notification Given:  Chaney BRANDY CHRISTIANE LELON, CMA 04/30/2024, 10:00 AM

## 2024-04-30 NOTE — Progress Notes (Signed)
 PHARMACY NOTE:  ANTIMICROBIAL RENAL DOSAGE ADJUSTMENT  Current antimicrobial regimen includes a mismatch between antimicrobial dosage and estimated renal function.  As per policy approved by the Pharmacy & Therapeutics and Medical Executive Committees, the antimicrobial dosage will be adjusted accordingly.  Current antimicrobial dosage:  Cephalexin  250 mg TID   Indication: UTI  Renal Function:   Estimated Creatinine Clearance: 32.6 mL/min (A) (by C-G formula based on SCr of 1.22 mg/dL (H)). []      On intermittent HD, scheduled: []      On CRRT    Antimicrobial dosage has been changed to:  Cephalexin  250 mg QID   Additional comments:   Thank you for allowing pharmacy to be a part of this patient's care.  Ransom Blanch PGY-1 Pharmacy Resident  [place] [facility]  04/30/2024 12:17 PM

## 2024-04-30 NOTE — Assessment & Plan Note (Signed)
 Continue metoprolol  and twice daily dosing and restarted losartan 

## 2024-04-30 NOTE — Assessment & Plan Note (Signed)
 Not sure if this is a urinary tract infection or not.  Patient had quite a few squamous cells seen on the urine analysis.  Changed Rocephin  over to Keflex .

## 2024-04-30 NOTE — Plan of Care (Signed)
  Problem: Clinical Measurements: Goal: Diagnostic test results will improve Outcome: Progressing   Problem: Nutrition: Goal: Adequate nutrition will be maintained Outcome: Progressing   Problem: Coping: Goal: Level of anxiety will decrease Outcome: Progressing   Problem: Coping: Goal: Ability to adjust to condition or change in health will improve Outcome: Progressing

## 2024-05-01 ENCOUNTER — Telehealth: Payer: Self-pay | Admitting: Podiatry

## 2024-05-01 DIAGNOSIS — N3000 Acute cystitis without hematuria: Secondary | ICD-10-CM | POA: Diagnosis not present

## 2024-05-01 DIAGNOSIS — D696 Thrombocytopenia, unspecified: Secondary | ICD-10-CM

## 2024-05-01 DIAGNOSIS — E559 Vitamin D deficiency, unspecified: Secondary | ICD-10-CM | POA: Insufficient documentation

## 2024-05-01 DIAGNOSIS — E1121 Type 2 diabetes mellitus with diabetic nephropathy: Secondary | ICD-10-CM

## 2024-05-01 DIAGNOSIS — E785 Hyperlipidemia, unspecified: Secondary | ICD-10-CM

## 2024-05-01 DIAGNOSIS — S82892S Other fracture of left lower leg, sequela: Secondary | ICD-10-CM

## 2024-05-01 DIAGNOSIS — L89152 Pressure ulcer of sacral region, stage 2: Secondary | ICD-10-CM | POA: Diagnosis not present

## 2024-05-01 DIAGNOSIS — I10 Essential (primary) hypertension: Secondary | ICD-10-CM

## 2024-05-01 LAB — GLUCOSE, CAPILLARY
Glucose-Capillary: 128 mg/dL — ABNORMAL HIGH (ref 70–99)
Glucose-Capillary: 174 mg/dL — ABNORMAL HIGH (ref 70–99)
Glucose-Capillary: 159 mg/dL — ABNORMAL HIGH (ref 70–99)
Glucose-Capillary: 157 mg/dL — ABNORMAL HIGH (ref 70–99)

## 2024-05-01 LAB — HEMOGLOBIN A1C
Hgb A1c MFr Bld: 8.1 % — ABNORMAL HIGH (ref 4.8–5.6)
Mean Plasma Glucose: 186 mg/dL

## 2024-05-01 MED ORDER — VITAMIN D (ERGOCALCIFEROL) 1.25 MG (50000 UNIT) PO CAPS
50000.0000 [IU] | ORAL_CAPSULE | ORAL | Status: DC
  Administered 2024-05-01: 50000 [IU] via ORAL
  Filled 2024-05-01: qty 1

## 2024-05-01 MED ORDER — METOPROLOL TARTRATE 25 MG PO TABS
25.0000 mg | ORAL_TABLET | Freq: Two times a day (BID) | ORAL | Status: DC
  Administered 2024-05-01 – 2024-05-02 (×2): 25 mg via ORAL
  Filled 2024-05-01 (×2): qty 1

## 2024-05-01 NOTE — Assessment & Plan Note (Signed)
 Vitamin D  level 24.22 despite being on vitamin D  2000 IU twice daily.  Will switch to vitamin D  50,000 units weekly for 8 weeks then can go back on her usual vitamin D  dose after that

## 2024-05-01 NOTE — NC FL2 (Addendum)
 [place]  MEDICAID FL2 LEVEL OF CARE FORM     IDENTIFICATION  Patient Name: Victoria Edwards Birthdate: 08-Jun-1929 Sex: female Admission Date (Current Location): 04/29/2024  [facility] and [place] Number:  Chiropodist and Address:  [facility], [address] [place], [place] [address]      Provider Number: [id number]  Attending Physician Name and Address:  Josette Ade, MD  Relative Name and Phone Number:       Current Level of Care: Hospital Recommended Level of Care: Skilled Nursing Facility Prior Approval Number:    Date Approved/Denied:   PASRR Number:  [id number]  Discharge Plan: SNF    Current Diagnoses: Patient Active Problem List   Diagnosis Date Noted   Pressure injury of skin 04/30/2024   Acute cystitis without hematuria 04/30/2024   Type 2 diabetes, controlled, with renal manifestation (HCC) 04/30/2024   Sacral decubitus ulcer, stage II (HCC) 04/30/2024   Thrombocytopenia (HCC) 04/30/2024   Closed left ankle fracture 04/29/2024   HTN (hypertension) 04/29/2024   Gout 04/29/2024   HLD (hyperlipidemia) 04/29/2024   Ankle fracture, bimalleolar, closed, left, initial encounter 04/29/2024    Orientation RESPIRATION BLADDER Height & Weight     Self, Time, Situation, Place  Normal Continent Weight: 190 lb (86.2 kg) Height:  5' 6 (167.6 cm)  BEHAVIORAL SYMPTOMS/MOOD NEUROLOGICAL BOWEL NUTRITION STATUS      Continent Diet (Regular)  AMBULATORY STATUS COMMUNICATION OF NEEDS Skin   Extensive Assist Verbally Bruising (Pressure Injury Coccyx Mid stage)                       Personal Care Assistance Level of Assistance  Bathing, Feeding, Dressing Bathing Assistance: Limited assistance Feeding assistance: Independent Dressing Assistance: Limited assistance     Functional Limitations Info  Sight, Hearing, Speech Sight Info: Impaired Hearing Info: Adequate      SPECIAL CARE FACTORS FREQUENCY  PT (By licensed PT),  OT (By licensed OT)     PT Frequency: 5x/week OT Frequency: 5x/week            Contractures      Additional Factors Info  Code Status, Allergies Code Status Info: Full Allergies Info: Demerol (Meperidine), Shellfish Allergy, Statins, Penicillins           Current Medications (05/01/2024):  This is the current hospital active medication list Current Facility-Administered Medications  Medication Dose Route Frequency Provider Last Rate Last Admin   acetaminophen  (TYLENOL ) tablet 650 mg  650 mg Oral Q6H PRN Paudel, Keshab, MD   650 mg at 04/30/24 0739   Or   acetaminophen  (TYLENOL ) suppository 650 mg  650 mg Rectal Q6H PRN Roann Gouty, MD       allopurinol  (ZYLOPRIM ) tablet 150 mg  150 mg Oral Daily Paudel, Keshab, MD   150 mg at 04/30/24 9076   aspirin  EC tablet 81 mg  81 mg Oral Daily Paudel, Keshab, MD   81 mg at 04/30/24 9076   calcium  carbonate (OS-CAL - dosed in mg of elemental calcium ) tablet 1,250 mg  1 tablet Oral BID WC Josette Ade, MD   1,250 mg at 04/30/24 1738   cephALEXin  (KEFLEX ) capsule 250 mg  250 mg Oral QID Josette Ade, MD   250 mg at 04/30/24 2143   cholecalciferol  (VITAMIN D3) 25 MCG (1000 UNIT) tablet 2,000 Units  2,000 Units Oral BID Paudel, Keshab, MD   2,000 Units at 04/30/24 2135   diazepam  (VALIUM ) tablet 2.5 mg  2.5 mg  Oral QHS PRN Paudel, Keshab, MD   2.5 mg at 04/30/24 2139   docusate sodium  (COLACE) capsule 100 mg  100 mg Oral BID Paudel, Keshab, MD   100 mg at 04/30/24 2134   enoxaparin  (LOVENOX ) injection 40 mg  40 mg Subcutaneous Q24H Paudel, Keshab, MD   40 mg at 04/30/24 2137   glipiZIDE  (GLUCOTROL  XL) 24 hr tablet 2.5 mg  2.5 mg Oral Q breakfast Josette Ade, MD       insulin  aspart (novoLOG ) injection 0-5 Units  0-5 Units Subcutaneous QHS Paudel, Nena, MD       insulin  aspart (novoLOG ) injection 0-6 Units  0-6 Units Subcutaneous TID WC Paudel, Keshab, MD   3 Units at 04/30/24 1218   levothyroxine  (SYNTHROID ) tablet 50 mcg  50  mcg Oral Q0600 Paudel, Keshab, MD   50 mcg at 05/01/24 9381   losartan  (COZAAR ) tablet 12.5 mg  12.5 mg Oral Daily Josette Ade, MD   12.5 mg at 04/30/24 1409   meclizine  (ANTIVERT ) tablet 25 mg  25 mg Oral TID PRN Paudel, Keshab, MD       metoprolol  tartrate (LOPRESSOR ) tablet 12.5 mg  12.5 mg Oral BID Josette Ade, MD   12.5 mg at 04/30/24 2134   morphine  (PF) 2 MG/ML injection 2 mg  2 mg Intravenous Q4H PRN Josette Ade, MD       multivitamin with minerals tablet 1 tablet  1 tablet Oral Daily Paudel, Keshab, MD   1 tablet at 04/30/24 9076   ondansetron  (ZOFRAN ) tablet 4 mg  4 mg Oral Q6H PRN Paudel, Keshab, MD       Or   ondansetron  (ZOFRAN ) injection 4 mg  4 mg Intravenous Q6H PRN Paudel, Nena, MD       oxyCODONE  (Oxy IR/ROXICODONE ) immediate release tablet 5 mg  5 mg Oral Q6H PRN Wieting, Richard, MD       pantoprazole  (PROTONIX ) EC tablet 40 mg  40 mg Oral BID AC Wieting, Richard, MD   40 mg at 04/30/24 1738   polyethylene glycol (MIRALAX  / GLYCOLAX ) packet 17 g  17 g Oral Daily PRN Paudel, Keshab, MD       sertraline  (ZOLOFT ) tablet 25 mg  25 mg Oral Daily Josette Ade, MD         Discharge Medications: Please see discharge summary for a list of discharge medications.  Relevant Imaging Results:  Relevant Lab Results:   Additional Information SSN: [id number]  Daneshia Tavano  [place], LCSW

## 2024-05-01 NOTE — Plan of Care (Signed)
  Problem: Education: Goal: Knowledge of General Education information will improve Description: Including pain rating scale, medication(s)/side effects and non-pharmacologic comfort measures Outcome: Progressing   Problem: Clinical Measurements: Goal: Diagnostic test results will improve Outcome: Progressing   Problem: Nutrition: Goal: Adequate nutrition will be maintained Outcome: Progressing   Problem: Coping: Goal: Level of anxiety will decrease Outcome: Progressing   Problem: Pain Managment: Goal: General experience of comfort will improve and/or be controlled Outcome: Progressing   Problem: Coping: Goal: Ability to adjust to condition or change in health will improve Outcome: Progressing

## 2024-05-01 NOTE — Progress Notes (Signed)
 Physical Therapy Treatment Patient Details Name: Victoria Edwards MRN: [id number] DOB: September 07, 1928 Today's Date: 05/01/2024   History of Present Illness Victoria Edwards is a 89 y.o. female with medical history significant for diabetes, gout, arthritis, HTN, HLD who came into ED by EMS for left ankle pain after a fall 2 days ago while dizzy. Pt found to have medial and lateral malleolar fracture on the left side and does not wish to undergo surgery at her age. Will be NWB with splint in place on LLE. Also being treated for UTI/acute cystitis.    PT Comments  Pt EOB with OT upon arrival initiating lateral scoot transfer to drop arm recliner.  She does need min a x 2 to transition but overall does well.  Once in recliner she is able to stand x 2 with min/mod a x 2 to RW with good ability to maintain NWB.  Stated she feels much more comfortable with +2 assist.  She is able to do some AROM in standing for LLE and in sitting.  Pt c/o some pain along splint edge and it is noted that fiberglass edge is against her skin and while redness is not noted yet, high risk for irritation or breakdown.  Edge is padded with maxipad with sticky side use to hold in place along outside and padding wrapped inside.  RN aware and Podiatry messaged via secure chat.  She also c/o pain L 5th toe.  No redness noted but I did wash and clean out a lot of dried moist skin between her toes.  She stated it has been several months since she has washed and cleaned her feet as she is unable to reach them and does not have an aide at home anymore.  Remained in chair and communicated with nursing staff about lateral scoot transfers back to bed.   If plan is discharge home, recommend the following: Two people to help with walking and/or transfers;A lot of help with bathing/dressing/bathroom;Assist for transportation;Help with stairs or ramp for entrance   Can travel by private vehicle        Equipment Recommendations       Recommendations for  Other Services       Precautions / Restrictions Precautions Precautions: Fall Recall of Precautions/Restrictions: Intact Restrictions Weight Bearing Restrictions Per Provider Order: Yes LLE Weight Bearing Per Provider Order: Non weight bearing     Mobility  Bed Mobility                 Patient Response: Cooperative  Transfers Overall transfer level: Needs assistance Equipment used: Rolling walker (2 wheels), None Transfers: Sit to/from Stand Sit to Stand: Min assist, Mod assist, +2 physical assistance          Lateral/Scoot Transfers: Min assist, +2 safety/equipment General transfer comment: does well with lat scoot.  stands with RW x 2 but unable to transfer in standing    Ambulation/Gait                   Stairs             Wheelchair Mobility     Tilt Bed Tilt Bed Patient Response: Cooperative  Modified Rankin (Stroke Patients Only)       Balance  Communication Communication Communication: No apparent difficulties  Cognition Arousal: Alert Behavior During Therapy: WFL for tasks assessed/performed   PT - Cognitive impairments: No apparent impairments                         Following commands: Intact      Cueing Cueing Techniques: Verbal cues  Exercises      General Comments General comments (skin integrity, edema, etc.): applied protective layer between skin and splint d/t fiberglass splint rubbing/breaking down pt's skin      Pertinent Vitals/Pain Pain Assessment Pain Assessment: Faces Faces Pain Scale: Hurts little more Pain Location: buttocks during transfer, L foot 5th digit Pain Descriptors / Indicators: Aching, Discomfort, Grimacing Pain Intervention(s): Limited activity within patient's tolerance, Monitored during session, Repositioned    Home Living Family/patient expects to be discharged to:: Private residence Living Arrangements: Other  relatives (grand daughter and her husband)   Type of Home: House Home Access: Ramped entrance       Home Layout: One level Home Equipment: Rollator (4 wheels);Toilet riser;Wheelchair - manual      Prior Function            PT Goals (current goals can now be found in the care plan section) Progress towards PT goals: Progressing toward goals    Frequency    Min 4X/week      PT Plan      Co-evaluation PT/OT/SLP Co-Evaluation/Treatment: Yes Reason for Co-Treatment: To address functional/ADL transfers PT goals addressed during session: Mobility/safety with mobility;Balance OT goals addressed during session: ADL's and self-care;Proper use of Adaptive equipment and DME      AM-PAC PT 6 Clicks Mobility   Outcome Measure  Help needed turning from your back to your side while in a flat bed without using bedrails?: A Lot Help needed moving from lying on your back to sitting on the side of a flat bed without using bedrails?: A Lot Help needed moving to and from a bed to a chair (including a wheelchair)?: A Lot Help needed standing up from a chair using your arms (e.g., wheelchair or bedside chair)?: A Lot   Help needed climbing 3-5 steps with a railing? : Total 6 Click Score: 9    End of Session Equipment Utilized During Treatment: Gait belt Activity Tolerance: Patient tolerated treatment well Patient left: in chair;with chair alarm set;with call bell/phone within reach Nurse Communication: Mobility status PT Visit Diagnosis: Other abnormalities of gait and mobility (R26.89);Muscle weakness (generalized) (M62.81);Difficulty in walking, not elsewhere classified (R26.2);Pain Pain - part of body: Ankle and joints of foot     Time: 8895-8872 PT Time Calculation (min) (ACUTE ONLY): 23 min  Charges:    $Therapeutic Exercise: 8-22 mins $Therapeutic Activity: 8-22 mins PT General Charges $$ ACUTE PT VISIT: 1 Visit                   Lauraine Gills, PTA 05/01/24, 1:40  PM

## 2024-05-01 NOTE — Evaluation (Addendum)
 Occupational Therapy Evaluation Patient Details Name: Victoria Edwards MRN: [id number] DOB: [birth year] Today's Date: 05/01/2024   History of Present Illness   Victoria Edwards is a 89 y.o. female with medical history significant for diabetes, gout, arthritis, HTN, HLD who came into ED by EMS for left ankle pain after a fall 2 days ago while dizzy. Pt found to have medial and lateral malleolar fracture on the left side and does not wish to undergo surgery at her age. Will be NWB with splint in place on LLE. Also being treated for UTI/acute cystitis.     Clinical Impressions Pt was seen for OT evaluation this date. PTA, pt resides at home with 24/7 supervision from family. Reports walking only 1-2x per day short household distances using RW with CGA from family since July/August. She reports being unable to perform some LB ADLs without assist, otherwise IND. Pt presents with deficits in strength, pain, balance, and activity tolerance, affecting safe and optimal ADL completion. Pt currently requires Min A for bed mobility with cueing and LLE assist to forward scoot. Pt attempted lateral scoot to recliner, however reports being nervous and prefers +2 assist, PT joined in for co-tx at this time. Pt required Min A x2 for lateral scoot from bed to recliner with LLE blocked to prevent weight bearing. Pt admits difficulty remember NWB status to LLE. She was then able to stand x2 trials from recliner to RW with Mod A x2 for ~30-45 seconds each trial. Assisted with combing matted hair in the back and left seated with PT in room.  Pt would benefit from skilled OT services to address noted impairments and functional limitations to maximize safety and independence while minimizing future risk of falls, injury, and readmission. Do anticipate the need for follow up OT services upon acute hospital DC.      If plan is discharge home, recommend the following:   Two people to help with walking and/or transfers;A lot of help  with bathing/dressing/bathroom;Assistance with cooking/housework;Help with stairs or ramp for entrance     Functional Status Assessment   Patient has had a recent decline in their functional status and demonstrates the ability to make significant improvements in function in a reasonable and predictable amount of time.     Equipment Recommendations   Other (comment) (defer to next venue)     Recommendations for Other Services         Precautions/Restrictions   Precautions Precautions: Fall Recall of Precautions/Restrictions: Intact Restrictions Weight Bearing Restrictions Per Provider Order: Yes LLE Weight Bearing Per Provider Order: Non weight bearing     Mobility Bed Mobility Overal bed mobility: Needs Assistance Bed Mobility: Supine to Sit     Supine to sit: HOB elevated, Used rails, Min assist     General bed mobility comments: trunkal assist to reach upright position at EOB and a little assist to forward scoot L hip    Transfers Overall transfer level: Needs assistance Equipment used: Rolling walker (2 wheels) Transfers: Sit to/from Stand, Bed to chair/wheelchair/BSC Sit to Stand: Min assist, Mod assist, +2 physical assistance          Lateral/Scoot Transfers: Mod assist, +2 physical assistance General transfer comment: cueing for sequencing and hand placement for transfer from EOB to recliner with Mod A x2 and blocking on L foot to maintain NWB; pt able to stand x2 trials from recliner with easy Mod A x2 (Min A x1 and Mod A x1) with standing tolerance of ~45 seconds to  a minute      Balance Overall balance assessment: Needs assistance (Simultaneous filing. User may not have seen previous data.) Sitting-balance support: Bilateral upper extremity supported, Feet supported (Simultaneous filing. User may not have seen previous data.) Sitting balance-Leahy Scale: Fair (Simultaneous filing. User may not have seen previous data.)     Standing balance  support: Bilateral upper extremity supported, Reliant on assistive device for balance (Simultaneous filing. User may not have seen previous data.) Standing balance-Leahy Scale: Poor (Simultaneous filing. User may not have seen previous data.) Standing balance comment: heavy UE support on RW and Min A x2 to maintain static and dynamic standing (Simultaneous filing. User may not have seen previous data.)                           ADL either performed or assessed with clinical judgement   ADL Overall ADL's : Needs assistance/impaired     Grooming: Brushing hair;Sitting;Moderate assistance Grooming Details (indicate cue type and reason): OT assisted with brushing matted hair in recliner     Lower Body Bathing: Maximal assistance;Sitting/lateral leans;Total assistance Lower Body Bathing Details (indicate cue type and reason): to bathe L foot between toes         Toilet Transfer: Moderate assistance;+2 for physical assistance Toilet Transfer Details (indicate cue type and reason): simulated lateral scoot to recliner with Mod A x2                 Vision         Perception         Praxis         Pertinent Vitals/Pain Pain Assessment Pain Assessment: Faces Faces Pain Scale: Hurts little more Pain Location: buttocks during transfer, L foot 5th digit Pain Descriptors / Indicators: Aching, Discomfort, Grimacing Pain Intervention(s): Limited activity within patient's tolerance, Monitored during session, Repositioned     Extremity/Trunk Assessment Upper Extremity Assessment Upper Extremity Assessment: Generalized weakness   Lower Extremity Assessment Lower Extremity Assessment: Generalized weakness   Cervical / Trunk Assessment Cervical / Trunk Assessment: Normal   Communication Communication Communication: No apparent difficulties   Cognition Arousal: Alert Behavior During Therapy: WFL for tasks assessed/performed                                  Following commands: Intact       Cueing  General Comments   Cueing Techniques: Verbal cues  applied protective layer between skin and splint d/t fiberglass splint rubbing/breaking down pt's skin   Exercises Other Exercises Other Exercises: Edu on role of OT in acute setting, DC recommendations and WB precautions as well as safest transfer techniques.   Shoulder Instructions      Home Living Family/patient expects to be discharged to:: Private residence Living Arrangements: Other relatives (grand daughter and her husband)   Type of Home: House Home Access: Ramped entrance     Home Layout: One level     Bathroom Shower/Tub: Sponge bathes at baseline   Bathroom Toilet: Standard Bathroom Accessibility: Yes   Home Equipment: Rollator (4 wheels);Toilet riser;Wheelchair - manual          Prior Functioning/Environment Prior Level of Function : Independent/Modified Independent             Mobility Comments: using 4WW in household. Supervision with gait around house from family d/t stumbles/near falls in the home ADLs Comments: Indep; however  up until March 2025 she had a caregiver who would assist with LB ADLs as pt reports being unable to bend down to clean herself    OT Problem List: Decreased strength;Decreased activity tolerance;Impaired balance (sitting and/or standing);Pain   OT Treatment/Interventions: Self-care/ADL training;Therapeutic exercise;Balance training;Therapeutic activities;DME and/or AE instruction;Patient/family education      OT Goals(Current goals can be found in the care plan section)   Acute Rehab OT Goals Patient Stated Goal: improve pain OT Goal Formulation: With patient Time For Goal Achievement: 05/15/24 Potential to Achieve Goals: Fair ADL Goals Pt Will Perform Lower Body Bathing: with min assist;sitting/lateral leans Pt Will Perform Lower Body Dressing: with min assist;sitting/lateral leans Pt Will Transfer to Toilet:  bedside commode;with mod assist;squat pivot transfer;stand pivot transfer   OT Frequency:  Min 2X/week    Co-evaluation   Reason for Co-Treatment: To address functional/ADL transfers PT goals addressed during session: Mobility/safety with mobility;Balance OT goals addressed during session: ADL's and self-care;Proper use of Adaptive equipment and DME      AM-PAC OT 6 Clicks Daily Activity     Outcome Measure Help from another person eating meals?: None Help from another person taking care of personal grooming?: None Help from another person toileting, which includes using toliet, bedpan, or urinal?: A Lot Help from another person bathing (including washing, rinsing, drying)?: A Lot Help from another person to put on and taking off regular upper body clothing?: A Little Help from another person to put on and taking off regular lower body clothing?: A Lot 6 Click Score: 17   End of Session Equipment Utilized During Treatment: Gait belt;Rolling walker (2 wheels) (LLE splint/cast) Nurse Communication: Mobility status  Activity Tolerance: Patient tolerated treatment well Patient left: in chair;with call bell/phone within reach;with chair alarm set  OT Visit Diagnosis: Other abnormalities of gait and mobility (R26.89);Muscle weakness (generalized) (M62.81);Unsteadiness on feet (R26.81);Pain Pain - Right/Left: Left Pain - part of body: Ankle and joints of foot                Time: 8946-8875 OT Time Calculation (min): 31 min Charges:  OT General Charges $OT Visit: 1 Visit OT Evaluation $OT Eval Moderate Complexity: 1 Mod  Leasa Kincannon, OTR/L 05/01/24, 2:28 PM  Asael Pann E Shilee Biggs 05/01/2024, 2:21 PM

## 2024-05-01 NOTE — Progress Notes (Signed)
 Progress Note   Patient: Victoria Edwards FMW:[id number] DOB: 1928/09/21 DOA: 04/29/2024     0 DOS: the patient was seen and examined on 05/01/2024   Brief hospital course: 89 y.o. female with medical history significant for diabetes, gout, arthritis, HTN, HLD who came into ED by EMS for left ankle pain after a fall 2 days ago.  Patient stated that she was dizzy and sat down and landed on her left ankle on the floor.  She denied hitting the head and witnessed by the family.  Patient denies any new weakness, numbness tingling or cold extremity.  She denies any headache, chest pain, shortness of breath, visual changes, no back pain.  Patient has some intermittent dysuria.   EMS gave her 50 mcg of fentanyl and route, blood glucose was 212, vital signs were stable.   ED Course: Upon arrival to the ED, patient is found to be hypertensive at 160/67, heart rate was 58 x-ray of the lower extremity showed medial and lateral malleolar fracture on the left side.  Patient was found to have positive urine analysis for UTI.  Podiatry team was called and they will come and see the patient.  Hospitalist service was consulted for evaluation for admission for fall, UTI, closed fracture of the left ankle.  9/1.  Patient hesitant about undergoing an operation at her age.  Had a fall couple days ago and diagnosed with ankle fracture.  PT recommending rehab and she has nonweightbearing left ankle.  Assessment and Plan: * Closed left ankle fracture Patient decided on nonoperative management.  Pain control.  PT recommending rehab.  Calcium  and vitamin D .  Will need rehab bed and insurance authorization.  Acute cystitis without hematuria Not sure if this is a urinary tract infection or not.  Patient had quite a few squamous cells seen on the urine analysis.  Changed Rocephin  over to Keflex .  Type 2 diabetes, controlled, with renal manifestation (HCC) Also with hyperglycemia.  Underlying chronic kidney disease stage IIIb.   Sliding scale insulin .  Restarted Glucotrol  XL.  HTN (hypertension) Continue metoprolol  and twice daily dosing and restarted losartan   Vitamin D  deficiency Vitamin D  level 24.22 despite being on vitamin D  2000 IU twice daily.  Will switch to vitamin D  50,000 units weekly for 8 weeks then can go back on her usual vitamin D  dose after that  Thrombocytopenia (HCC) Continue to monitor.  Hepatitis C negative  Sacral decubitus ulcer, stage II (HCC) Present on admission.  Appreciate wound care nurse consultation.  Gout Continue allopurinol       Subjective: Patient complains of little pain in her left foot.  Admitted with ankle fracture and put in a splint.  Patient nonweightbearing left foot.  Patient declined surgical intervention.  Physical Exam: Vitals:   04/30/24 2034 04/30/24 2112 05/01/24 0403 05/01/24 0808  BP: (!) 154/62 122/76 (!) 155/57 (!) 146/69  Pulse: 65 97 66 65  Resp: 14 14 16 19   Temp: 98 F (36.7 C) 97.7 F (36.5 C) 98 F (36.7 C) 98.2 F (36.8 C)  TempSrc:      SpO2: 98% 94% 96% 97%  Weight:      Height:       Physical Exam HENT:     Head: Normocephalic.  Eyes:     General: Lids are normal.     Conjunctiva/sclera: Conjunctivae normal.  Cardiovascular:     Rate and Rhythm: Normal rate and regular rhythm.     Heart sounds: Normal heart sounds, S1 normal and  S2 normal.  Pulmonary:     Breath sounds: No decreased breath sounds, wheezing, rhonchi or rales.  Abdominal:     Palpations: Abdomen is soft.     Tenderness: There is no abdominal tenderness.  Musculoskeletal:     Comments: Left ankle in splint  Skin:    General: Skin is warm.     Findings: No rash.  Neurological:     Mental Status: She is alert and oriented to person, place, and time.     Comments: Able to wiggle toes.     Data Reviewed: Vitamin D24.22, hemoglobin 10.8, creatinine 1.22  Family Communication: Updated patient's granddaughter on the phone  Disposition: Status is:  Observation TOC starting insurance authorization for [employer]  Planned Discharge Destination: Rehab    Time spent: 28 minutes  Author: Charlie Patterson, MD 05/01/2024 2:59 PM  For on call review [email or web address].

## 2024-05-01 NOTE — Telephone Encounter (Signed)
 Called to get patient scheduled with Dr.McDonald in [place] on 9/17 for left ankle fracture , Xrays. Mailbox is full I was unable to leave a message. I will reach back out later today.

## 2024-05-01 NOTE — TOC Initial Note (Signed)
 Transition of Care [facility]) - Initial/Assessment Note    Patient Details  Name: Victoria Edwards MRN: [id number] Date of Birth: 1929/08/29  [facility] CM/SW Contact:    Victoria Louder, LCSW Phone Number: 05/01/2024, 3:25 PM  Clinical Narrative:    [name] out information to SNF's in [place]. LCSWA will presented Facilities to patient at the bedside. Patient and family selected [facility]. Firefighter approved. LCSWA confirmed bed availability for tomorrow.   [id number] Dates: 9/2-05/03/2024 Next Review Date: 05/03/2024   TOC to follow for discharge       Patient Goals and CMS Choice            Expected Discharge Plan and Services                                              Prior Living Arrangements/Services                       Activities of Daily Living      Permission Sought/Granted                  Emotional Assessment              Admission diagnosis:  UTI (urinary tract infection) [N39.0] Fall, initial encounter [W19.XXXA] Closed fracture of left ankle, initial encounter [D17.107J] Urinary tract infection without hematuria, site unspecified [N39.0] Patient Active Problem List   Diagnosis Date Noted   Vitamin D  deficiency 05/01/2024   Pressure injury of skin 04/30/2024   Acute cystitis without hematuria 04/30/2024   Type 2 diabetes, controlled, with renal manifestation (HCC) 04/30/2024   Sacral decubitus ulcer, stage II (HCC) 04/30/2024   Thrombocytopenia (HCC) 04/30/2024   Closed left ankle fracture 04/29/2024   HTN (hypertension) 04/29/2024   Gout 04/29/2024   HLD (hyperlipidemia) 04/29/2024   Ankle fracture, bimalleolar, closed, left, initial encounter 04/29/2024   PCP:  Sadie Manna, MD Pharmacy:   [facility] [address]), [place] - [address] [place] ([place]) [place] [address] Phone: [phone] Fax: [phone]  [facility] -  [place], [place] - [address] [place] [address] Phone: [phone] Fax: [phone]  [id number] - [place], [place] - [address] [place] [address] Phone: [phone] Fax: [phone]     Social Drivers of Health (SDOH) Social History: SDOH Screenings   Food Insecurity: No Food Insecurity (04/30/2024)  Housing: Low Risk  (04/30/2024)  Transportation Needs: No Transportation Needs (04/30/2024)  Utilities: Not At Risk (04/30/2024)  Financial Resource Strain: Low Risk  (01/27/2024)   Received from [facility]  Social Connections: Socially Isolated (04/30/2024)  Tobacco Use: Low Risk  (04/29/2024)   SDOH Interventions:     Readmission Risk Interventions     No data to display

## 2024-05-02 ENCOUNTER — Telehealth: Payer: Self-pay | Admitting: Podiatry

## 2024-05-02 DIAGNOSIS — S82892A Other fracture of left lower leg, initial encounter for closed fracture: Secondary | ICD-10-CM | POA: Diagnosis not present

## 2024-05-02 DIAGNOSIS — S82842A Displaced bimalleolar fracture of left lower leg, initial encounter for closed fracture: Secondary | ICD-10-CM | POA: Diagnosis not present

## 2024-05-02 DIAGNOSIS — E559 Vitamin D deficiency, unspecified: Secondary | ICD-10-CM

## 2024-05-02 DIAGNOSIS — E785 Hyperlipidemia, unspecified: Secondary | ICD-10-CM | POA: Diagnosis not present

## 2024-05-02 DIAGNOSIS — N3000 Acute cystitis without hematuria: Secondary | ICD-10-CM | POA: Diagnosis not present

## 2024-05-02 DIAGNOSIS — D696 Thrombocytopenia, unspecified: Secondary | ICD-10-CM

## 2024-05-02 DIAGNOSIS — L89152 Pressure ulcer of sacral region, stage 2: Secondary | ICD-10-CM

## 2024-05-02 LAB — GLUCOSE, CAPILLARY
Glucose-Capillary: 131 mg/dL — ABNORMAL HIGH (ref 70–99)
Glucose-Capillary: 166 mg/dL — ABNORMAL HIGH (ref 70–99)

## 2024-05-02 MED ORDER — DIAZEPAM 5 MG PO TABS
2.5000 mg | ORAL_TABLET | Freq: Every evening | ORAL | 0 refills | Status: AC | PRN
Start: 2024-05-02 — End: ?

## 2024-05-02 MED ORDER — DOCUSATE SODIUM 100 MG PO CAPS
100.0000 mg | ORAL_CAPSULE | Freq: Two times a day (BID) | ORAL | Status: AC
Start: 2024-05-02 — End: ?

## 2024-05-02 MED ORDER — POLYETHYLENE GLYCOL 3350 17 G PO PACK
17.0000 g | PACK | Freq: Every day | ORAL | Status: AC | PRN
Start: 2024-05-02 — End: ?

## 2024-05-02 MED ORDER — OXYCODONE HCL 5 MG PO TABS: 5.0000 mg | ORAL_TABLET | Freq: Four times a day (QID) | ORAL | 0 refills | Status: AC | PRN

## 2024-05-02 MED ORDER — CEPHALEXIN 250 MG PO CAPS: 250.0000 mg | ORAL_CAPSULE | Freq: Four times a day (QID) | ORAL | Status: AC

## 2024-05-02 MED ORDER — CALCIUM CARBONATE 1250 (500 CA) MG PO TABS
1.0000 | ORAL_TABLET | Freq: Two times a day (BID) | ORAL | Status: AC
Start: 2024-05-02 — End: ?

## 2024-05-02 NOTE — Progress Notes (Signed)
 Splint adjusted with 4 rolls of cast padding and positioning for comfort, no skin breakdown. Emphasized importance of offloading heel, maintain pillow under calf here and at discharge. Follow up with me in 2 weeks for new xrays

## 2024-05-02 NOTE — TOC Transition Note (Signed)
 Transition of Care [facility]) - Discharge Note   Patient Details  Name: Victoria Edwards MRN: [id number] Date of Birth: [birth year]  [facility] CM/SW Contact:  Alvaro Louder, LCSW Phone Number: 05/02/2024, 11:13 AM   Clinical Narrative:   LCSWA received insurance approval for patient to admit to SNF [facility]. LCSWA confirmed with MD that patient is stable for discharge. LCSWA notified the patient and Daughter and they are in agreement with discharge. LCSWA confirmed bed is available at SNF. Transport arranged with [facility] for next available.  RM; 605, Report Number: [phone].   Final next level of care: Skilled Nursing Facility Barriers to Discharge: No Barriers Identified   Patient Goals and CMS Choice            Discharge Placement              Patient [facility] bed at: [facility] Patient to be transferred to facility by: [facility] Name of family member notified: Donny Patient and family notified of of transfer: 05/02/24  Discharge Plan and Services Additional resources added to the After Visit Summary for                                       Social Drivers of Health (SDOH) Interventions SDOH Screenings   Food Insecurity: No Food Insecurity (04/30/2024)  Housing: Low Risk  (04/30/2024)  Transportation Needs: No Transportation Needs (04/30/2024)  Utilities: Not At Risk (04/30/2024)  Financial Resource Strain: Low Risk  (01/27/2024)   Received from [facility]  Social Connections: Socially Isolated (04/30/2024)  Tobacco Use: Low Risk  (04/29/2024)     Readmission Risk Interventions     No data to display

## 2024-05-02 NOTE — Plan of Care (Signed)

## 2024-05-02 NOTE — Discharge Summary (Signed)
 Physician Discharge Summary   Patient: Victoria Edwards MRN: [id number] DOB: 1929-06-06  Admit date:     04/29/2024  Discharge date: 05/02/24  Discharge Physician: Amaryllis Dare   PCP: Sadie Manna, MD   Recommendations at discharge:  Please obtain CBC and BMP on follow-up Follow-up with primary care provider Follow-up with podiatry in 1 to 2 weeks for repeat x-rays Patient will remain nonweightbearing on left ankle until cleared by podiatry.  Discharge Diagnoses: Principal Problem:   Closed left ankle fracture Active Problems:   Acute cystitis without hematuria   Type 2 diabetes, controlled, with renal manifestation (HCC)   HTN (hypertension)   Gout   HLD (hyperlipidemia)   Ankle fracture, bimalleolar, closed, left, initial encounter   Pressure injury of skin   Sacral decubitus ulcer, stage II (HCC)   Thrombocytopenia (HCC)   Vitamin D  deficiency   Hospital Course: 89 y.o. female with medical history significant for diabetes, gout, arthritis, HTN, HLD who came into ED by EMS for left ankle pain after a fall 2 days ago.  Patient stated that she was dizzy and sat down and landed on her left ankle on the floor.  She denied hitting the head and witnessed by the family.  Patient denies any new weakness, numbness tingling or cold extremity.  She denies any headache, chest pain, shortness of breath, visual changes, no back pain.  Patient has some intermittent dysuria.   EMS gave her 50 mcg of fentanyl and route, blood glucose was 212, vital signs were stable.   ED Course: Upon arrival to the ED, patient is found to be hypertensive at 160/67, heart rate was 58 x-ray of the lower extremity showed medial and lateral malleolar fracture on the left side.  Patient was found to have positive urine analysis for UTI.  Podiatry team was called and they will come and see the patient.  Hospitalist service was consulted for evaluation for admission for fall, UTI, closed fracture of the left  ankle.  9/1.  Patient hesitant about undergoing an operation at her age.  Had a fall couple days ago and diagnosed with ankle fracture.  PT recommending rehab and she has nonweightbearing left ankle.  9/2: Remained hemodynamically stable.  Patient will remain nonweightbearing on left ankle especially left heel, should maintain pillow under the calf and need to have a close follow-up with podiatry in 1 to 2 weeks for new x-rays.  For concern of acute cystitis patient was given a course of Keflex , she will continue for next 2 days to complete the course.  Patient was also found to have low vitamin D  levels at 24.22, she was given a dose of 50,000 unit which was not continued and she can go back to her home dose of vitamin D  2000 ID units twice daily.  Patient was given pain medication to use as needed if simple Tylenol  does not help.  Please be mindful and avoid constipation and more falls while taking opioids.  Patient will continue the rest of her home medications and need to have a close follow-up with her providers for further assistance.  Assessment and Plan: * Closed left ankle fracture Patient decided on nonoperative management.  Pain control.  PT recommending rehab.  Calcium  and vitamin D .  Will need rehab bed and insurance authorization.  Acute cystitis without hematuria Not sure if this is a urinary tract infection or not.  Patient had quite a few squamous cells seen on the urine analysis.  Changed Rocephin  over to  Keflex -will complete the course in 2 more days  Type 2 diabetes, controlled, with renal manifestation (HCC) Underlying chronic kidney disease stage IIIb.  Sliding scale insulin .  Restarted Glucotrol  XL.  HTN (hypertension) Continue metoprolol  and twice daily dosing and restarted losartan   Vitamin D  deficiency Vitamin D  level 24.22 despite being on vitamin D  2000 IU twice daily.  She was given 1 dose of 50,000 units of vitamin D  and will continue with home 2000 units  twice daily  Thrombocytopenia (HCC) Continue to monitor.  Hepatitis C negative  Sacral decubitus ulcer, stage II (HCC) Present on admission.  Appreciate wound care nurse consultation.  Gout Continue allopurinol      Pain control - [place]  Controlled Substance Reporting System database was reviewed. and patient was instructed, not to drive, operate heavy machinery, perform activities at heights, swimming or participation in water activities or provide baby-sitting services while on Pain, Sleep and Anxiety Medications; until their outpatient Physician has advised to do so again. Also recommended to not to take more than prescribed Pain, Sleep and Anxiety Medications.  Consultants: Podiatry Procedures performed: None Disposition: Skilled nursing facility Diet recommendation:  Discharge Diet Orders (From admission, onward)     Start     Ordered   05/02/24 0000  Diet - low sodium heart healthy        05/02/24 1026           Cardiac and Carb modified diet DISCHARGE MEDICATION: Allergies as of 05/02/2024       Reactions   Demerol [meperidine] Other (See Comments)   Reaction:  Confusion    Shellfish Allergy    Statins Other (See Comments)   Penicillins Itching, Rash, Other (See Comments)   Has patient had a PCN reaction causing immediate rash, facial/tongue/throat swelling, SOB or lightheadedness with hypotension: No Has patient had a PCN reaction causing severe rash involving mucus membranes or skin necrosis: No Has patient had a PCN reaction that required hospitalization No Has patient had a PCN reaction occurring within the last 10 years: No If all of the above answers are NO, then may proceed with Cephalosporin use.        Medication List     STOP taking these medications    benzonatate  100 MG capsule Commonly known as: Tessalon  Perles   mometasone 0.1 % lotion Commonly known as: ELOCON       TAKE these medications    allopurinol  300 MG  tablet Commonly known as: ZYLOPRIM  Take 150 mg by mouth daily.   aspirin  EC 81 MG tablet Take 81 mg by mouth daily.   calcium  carbonate 1250 (500 Ca) MG tablet Commonly known as: OS-CAL - dosed in mg of elemental calcium  Take 1 tablet (1,250 mg total) by mouth 2 (two) times daily with a meal.   cephALEXin  250 MG capsule Commonly known as: KEFLEX  Take 1 capsule (250 mg total) by mouth 4 (four) times daily for 2 days.   diazepam  5 MG tablet Commonly known as: VALIUM  Take 0.5 tablets (2.5 mg total) by mouth at bedtime as needed (for sleep).   docusate sodium  100 MG capsule Commonly known as: COLACE Take 1 capsule (100 mg total) by mouth 2 (two) times daily.   Fish Oil 1000 MG Caps Take 1,000 mg by mouth 2 (two) times daily.   glipiZIDE  5 MG 24 hr tablet Commonly known as: GLUCOTROL  XL Take 5 mg by mouth daily.   ipratropium 0.03 % nasal spray Commonly known as: ATROVENT Place 2 sprays into  both nostrils 2 (two) times daily.   ketoconazole 2 % shampoo Commonly known as: NIZORAL Apply 1 application topically every 14 (fourteen) days.   levothyroxine  50 MCG tablet Commonly known as: SYNTHROID  Take 50 mcg by mouth daily before breakfast.   losartan  25 MG tablet Commonly known as: COZAAR  Take 12.5 mg by mouth daily. 1/2 tablet every day   meclizine  25 MG tablet Commonly known as: ANTIVERT  Take 1 tablet (25 mg total) by mouth 3 (three) times daily as needed for dizziness.   metoprolol  tartrate 25 MG tablet Commonly known as: LOPRESSOR  Take 25 mg by mouth 3 (three) times daily.   multivitamin with minerals Tabs tablet Take 1 tablet by mouth daily.   niacin 500 MG ER tablet Commonly known as: VITAMIN B3 Take 500 mg by mouth at bedtime.   omeprazole 20 MG capsule Commonly known as: PRILOSEC Take 20 mg by mouth daily as needed.   oxyCODONE  5 MG immediate release tablet Commonly known as: Oxy IR/ROXICODONE  Take 1 tablet (5 mg total) by mouth every 6 (six) hours as  needed for moderate pain (pain score 4-6) or severe pain (pain score 7-10).   polyethylene glycol 17 g packet Commonly known as: MIRALAX  / GLYCOLAX  Take 17 g by mouth daily as needed for mild constipation.   Red Yeast Rice 600 MG Caps Take 600 mg by mouth daily.   sertraline  25 MG tablet Commonly known as: ZOLOFT  Take 25 mg by mouth daily.   Vitamin D3 50 MCG (2000 UT) Tabs Take 2,000 Units by mouth 2 (two) times daily.               Discharge Care Instructions  (From admission, onward)           Start     Ordered   05/02/24 0000  Discharge wound care:       Comments: Cleanse sacral wound with saline, pat dry Cover open wound with single layer of xeroform and top with foam. Change every other day   05/02/24 1026            Contact information for follow-up providers     Sadie Manna, MD. Schedule an appointment as soon as possible for a visit in 1 week(s).   Specialty: Internal Medicine Contact information: [address] [place] [address] [phone]         Silva Juliene SAUNDERS, DPM. Schedule an appointment as soon as possible for a visit in 1 week(s).   Specialty: Podiatry Contact information: [address] [place] [address] [phone]              Contact information for after-discharge care     Destination     [facility] .   Service: Skilled Nursing Contact information: [address] [place]  [phone]                    Discharge Exam: Victoria Edwards   04/29/24 1142  Weight: 86.2 kg   General.  Overweight elderly lady, in no acute distress. Pulmonary.  Lungs clear bilaterally, normal respiratory effort. CV.  Regular rate and rhythm, no JVD, rub or murmur. Abdomen.  Soft, nontender, nondistended, BS positive. CNS.  Alert and oriented .  No focal neurologic deficit. Extremities.   No edema on right, left lower extremity with cast and Ace wrap Psychiatry.  Judgment and insight appears normal.   Condition at discharge:  stable  The results of significant diagnostics from this hospitalization (including imaging, microbiology, ancillary and laboratory) are listed below for reference.   Imaging Studies: DG Tibia/Fibula Left Result Date: 04/29/2024 CLINICAL DATA:  Dizziness, fall, pain. EXAM: LEFT TIBIA AND FIBULA - 2 VIEW COMPARISON:  Left ankle same day. FINDINGS: Medial and lateral malleolar fractures, as on dedicated views of the left ankle done the same day. Tibia fibular otherwise intact. IMPRESSION: Medial and lateral malleolar fractures, better seen and described on dedicated views of the left ankle done the same day. No additional fracture. Electronically Signed   By: Newell Eke M.D.   On: 04/29/2024 13:26   DG Knee Complete 4 Views Left Result Date: 04/29/2024 CLINICAL DATA:  Fall, dizziness, pain. EXAM: LEFT KNEE - COMPLETE 4+ VIEW COMPARISON:  None Available. FINDINGS: No acute osseous or joint abnormality. Trace patellofemoral osteophytosis. Lateral joint space narrowing. Slight varus angulation of the knee. Chondrocalcinosis in the medial compartment. IMPRESSION: Degenerative changes in the knee.  No acute findings. Electronically Signed   By: Newell Eke M.D.   On: 04/29/2024 13:25   DG Chest 2 View Result Date: 04/29/2024 CLINICAL DATA:  Fall.  Dizziness. EXAM: CHEST - 2 VIEW COMPARISON:  12/24/2023. FINDINGS: Trachea is midline. Heart size stable. Thoracic aorta is calcified. Lungs are clear. No pleural fluid. Degenerative changes in the spine. IMPRESSION: No acute findings. Electronically Signed   By: Newell Eke M.D.   On: 04/29/2024 13:23   DG Ankle Complete Left Result Date: 04/29/2024 CLINICAL DATA:  Fall, pain. EXAM: LEFT ANKLE COMPLETE - 3+ VIEW COMPARISON:  None Available. FINDINGS: There is a minimally displaced obliquely oriented fracture of  the distal fibular metadiaphysis. Minimally displaced transversely oriented fracture of the medial malleolus. Ankle mortise is grossly intact. Diffuse soft tissue swelling. IMPRESSION: Medial and lateral malleolar fractures. Electronically Signed   By: Newell Eke M.D.   On: 04/29/2024 13:22    Microbiology: Results for orders placed or performed during the hospital encounter of 12/24/23  Resp panel by RT-PCR (RSV, Flu A&B, Covid) Anterior Nasal Swab     Status: None   Collection Time: 12/24/23  8:29 PM   Specimen: Anterior Nasal Swab  Result Value Ref Range Status   SARS Coronavirus 2 by RT PCR NEGATIVE NEGATIVE Final    Comment: (NOTE) SARS-CoV-2 target nucleic acids are NOT DETECTED.  The SARS-CoV-2 RNA is generally detectable in upper respiratory specimens during the acute phase of infection. The lowest concentration of SARS-CoV-2 viral copies this assay can detect is 138 copies/mL. A negative result does not preclude SARS-Cov-2 infection and should not be used as the sole basis for treatment or other patient management decisions. A negative result may occur with  improper specimen collection/handling, submission of specimen other than nasopharyngeal swab, presence of viral mutation(s) within the areas targeted by this assay, and inadequate number of viral copies(<138 copies/mL). A negative result must be combined with clinical observations, patient history, and epidemiological information. The expected result is Negative.  Fact Sheet for Patients:  BloggerCourse.com  Fact Sheet for Healthcare Providers:  SeriousBroker.it  This test is no t yet approved or cleared by the [place]  FDA and  has been authorized for detection and/or diagnosis of SARS-CoV-2 by FDA under an Emergency Use Authorization (EUA). This EUA will remain  in effect (meaning this test can be used) for the duration of the COVID-19 declaration under  Section 564(b)(1) of the Act, 21 U.S.C.section 360bbb-3(b)(1), unless the authorization is terminated  or revoked sooner.       Influenza A by PCR NEGATIVE NEGATIVE Final   Influenza B by PCR NEGATIVE NEGATIVE Final    Comment: (NOTE) The Xpert Xpress SARS-CoV-2/FLU/RSV plus assay is intended as an aid in the diagnosis of influenza from Nasopharyngeal swab specimens and should not be used as a sole basis for treatment. Nasal washings and aspirates are unacceptable for Xpert Xpress SARS-CoV-2/FLU/RSV testing.  Fact Sheet for Patients: BloggerCourse.com  Fact Sheet for Healthcare Providers: SeriousBroker.it  This test is not yet approved or cleared by the [place]  FDA and has been authorized for detection and/or diagnosis of SARS-CoV-2 by FDA under an Emergency Use Authorization (EUA). This EUA will remain in effect (meaning this test can be used) for the duration of the COVID-19 declaration under Section 564(b)(1) of the Act, 21 U.S.C. section 360bbb-3(b)(1), unless the authorization is terminated or revoked.     Resp Syncytial Virus by PCR NEGATIVE NEGATIVE Final    Comment: (NOTE) Fact Sheet for Patients: BloggerCourse.com  Fact Sheet for Healthcare Providers: SeriousBroker.it  This test is not yet approved or cleared by the [place]  FDA and has been authorized for detection and/or diagnosis of SARS-CoV-2 by FDA under an Emergency Use Authorization (EUA). This EUA will remain in effect (meaning this test can be used) for the duration of the COVID-19 declaration under Section 564(b)(1) of the Act, 21 U.S.C. section 360bbb-3(b)(1), unless the authorization is terminated or revoked.  Performed at [facility], [address] [place], [place] [address]   Group A Strep by PCR [facility] Only)     Status: None   Collection Time: 12/24/23  8:29 PM   Specimen:  Anterior Nasal Swab; Sterile Swab  Result Value Ref Range Status   Group A Strep by PCR NOT DETECTED NOT DETECTED Final    Comment: Performed at [facility], [address] [place], [place] [address]    Labs: CBC: Recent Labs  Lab 04/29/24 1157 04/30/24 0553  WBC 5.9 4.4  HGB 11.7* 10.8*  HCT 35.2* 32.6*  MCV 90.5 88.8  PLT 123* 125*   Basic Metabolic Panel: Recent Labs  Lab 04/29/24 1157 04/30/24 0553  NA 134* 135  K 4.2 3.8  CL 103 102  CO2 21* 22  GLUCOSE 189* 119*  BUN 31* 28*  CREATININE 1.27* 1.22*  CALCIUM  8.7* 8.2*   Liver Function Tests: Recent Labs  Lab 04/29/24 1157  AST 32  ALT 18  ALKPHOS 48  BILITOT 0.7  PROT 6.9  ALBUMIN 3.3*   CBG: Recent Labs  Lab 05/01/24 0815 05/01/24 1136 05/01/24 1640 05/01/24 2053 05/02/24 0718  GLUCAP 128* 174* 159* 157* 131*    Discharge time spent: greater than 30 minutes.  This record has been created using Conservation officer, historic buildings. Errors have been sought and corrected,but may not always be located. Such creation errors do not reflect on the standard of care.   Signed: Amaryllis Dare, MD [facility] 05/02/2024

## 2024-05-02 NOTE — Care Plan (Addendum)
 Report given to Diplomatic Services operational officer at [employer]. All questions answered. AVD reviewed with patient as well Grand daughter called to update  IV removed All belongings went with patient

## 2024-05-02 NOTE — Plan of Care (Signed)
  Problem: Education: Goal: Knowledge of General Education information will improve Description: Including pain rating scale, medication(s)/side effects and non-pharmacologic comfort measures Outcome: Progressing   Problem: Clinical Measurements: Goal: Respiratory complications will improve Outcome: Progressing   Problem: Nutrition: Goal: Adequate nutrition will be maintained Outcome: Progressing   Problem: Elimination: Goal: Will not experience complications related to urinary retention Outcome: Progressing   

## 2024-05-02 NOTE — Telephone Encounter (Signed)
 I called and spoke with [name] . Patient is now scheduled .

## 2024-05-02 NOTE — Progress Notes (Signed)
 Occupational Therapy Treatment Patient Details Name: Victoria Edwards MRN: [id number] DOB: 09/15/1928 Today's Date: 05/02/2024   History of present illness Victoria Edwards is a 89 y.o. female with medical history significant for diabetes, gout, arthritis, HTN, HLD who came into ED by EMS for left ankle pain after a fall 2 days ago while dizzy. Pt found to have medial and lateral malleolar fracture on the left side and does not wish to undergo surgery at her age. Will be NWB with splint in place on LLE. Also being treated for UTI/acute cystitis.   OT comments  Pt is supine in bed on arrival on bed pan. Pleasant and agreeable to OT session. She reports continued L foot pain. Pt performed rolling in bed with CGA using rails for bed pan removal and hygiene performance requiring max A. CGA for sidelying to sit at EOB. Able to stand and perform SPT to recliner using RW with Mod A x2 this date with more difficulty maintaining NWB. Pt is highly motivated and glad to be up in the chair with possible discharge to rehab this afternoon. Pt left with all needs in place and will cont to require skilled acute OT services to maximize her safety and IND to return to PLOF.       If plan is discharge home, recommend the following:  Two people to help with walking and/or transfers;A lot of help with bathing/dressing/bathroom;Assistance with cooking/housework;Help with stairs or ramp for entrance   Equipment Recommendations  Other (comment) (defer)    Recommendations for Other Services      Precautions / Restrictions Precautions Precautions: Fall Recall of Precautions/Restrictions: Intact Restrictions Weight Bearing Restrictions Per Provider Order: Yes LLE Weight Bearing Per Provider Order: Non weight bearing       Mobility Bed Mobility Overal bed mobility: Needs Assistance Bed Mobility: Supine to Sit, Rolling Rolling: Contact guard assist, Used rails   Supine to sit: Contact guard, HOB elevated, Used rails           Transfers Overall transfer level: Needs assistance Equipment used: Rolling walker (2 wheels) Transfers: Sit to/from Stand, Bed to chair/wheelchair/BSC Sit to Stand: Mod assist, +2 physical assistance, From elevated surface     Step pivot transfers: Mod assist, +2 physical assistance     General transfer comment: increased difficulty maintaing NWB to LLE during stand pivot to recliner     Balance Overall balance assessment: Needs assistance Sitting-balance support: Feet supported Sitting balance-Leahy Scale: Good     Standing balance support: Bilateral upper extremity supported, During functional activity, Reliant on assistive device for balance Standing balance-Leahy Scale: Poor Standing balance comment: heavy UE support on RW and Mod A x2 to maintain static and dynamic standing                           ADL either performed or assessed with clinical judgement   ADL Overall ADL's : Needs assistance/impaired                         Toilet Transfer: Moderate assistance;Rolling walker (2 wheels);+2 for physical assistance;Stand-pivot Toilet Transfer Details (indicate cue type and reason): simulated stand pivot to recliner from EOB using RW Toileting- Clothing Manipulation and Hygiene: Total assistance;Bed level Toileting - Clothing Manipulation Details (indicate cue type and reason): after cont use of bed pan            Extremity/Trunk Assessment  Vision       Perception     Praxis     Communication Communication Communication: No apparent difficulties   Cognition Arousal: Alert Behavior During Therapy: WFL for tasks assessed/performed                                 Following commands: Intact        Cueing   Cueing Techniques: Verbal cues  Exercises      Shoulder Instructions       General Comments      Pertinent Vitals/ Pain       Pain Assessment Pain Assessment: Faces Faces Pain  Scale: Hurts a little bit Pain Location: L LE Pain Descriptors / Indicators: Aching, Discomfort, Grimacing Pain Intervention(s): Monitored during session, Repositioned  Home Living                                          Prior Functioning/Environment              Frequency  Min 2X/week        Progress Toward Goals  OT Goals(current goals can now be found in the care plan section)  Progress towards OT goals: Progressing toward goals  Acute Rehab OT Goals Patient Stated Goal: improve pain and function OT Goal Formulation: With patient Time For Goal Achievement: 05/15/24 Potential to Achieve Goals: Fair  Plan      Co-evaluation    PT/OT/SLP Co-Evaluation/Treatment: Yes Reason for Co-Treatment: To address functional/ADL transfers;For patient/therapist safety PT goals addressed during session: Mobility/safety with mobility;Balance;Proper use of DME OT goals addressed during session: ADL's and self-care;Proper use of Adaptive equipment and DME      AM-PAC OT 6 Clicks Daily Activity     Outcome Measure   Help from another person eating meals?: None Help from another person taking care of personal grooming?: None Help from another person toileting, which includes using toliet, bedpan, or urinal?: A Lot Help from another person bathing (including washing, rinsing, drying)?: A Lot Help from another person to put on and taking off regular upper body clothing?: A Little Help from another person to put on and taking off regular lower body clothing?: A Lot 6 Click Score: 17    End of Session Equipment Utilized During Treatment: Gait belt;Rolling walker (2 wheels)  OT Visit Diagnosis: Other abnormalities of gait and mobility (R26.89);Muscle weakness (generalized) (M62.81);Unsteadiness on feet (R26.81);Pain Pain - Right/Left: Left Pain - part of body: Ankle and joints of foot   Activity Tolerance Patient tolerated treatment well   Patient Left in  chair;with call bell/phone within reach;with chair alarm set   Nurse Communication Mobility status        Time: 8971-8955 OT Time Calculation (min): 16 min  Charges: OT General Charges $OT Visit: 1 Visit OT Treatments $Self Care/Home Management : 8-22 mins  Nhung Danko, OTR/L  05/02/24, 12:41 PM   Demetrick Eichenberger E Marios Gaiser 05/02/2024, 12:38 PM

## 2024-05-02 NOTE — Progress Notes (Signed)
 Physical Therapy Treatment Patient Details Name: Victoria Edwards MRN: [id number] DOB: 12-28-1928 Today's Date: 05/02/2024   History of Present Illness Victoria Edwards is a 89 y.o. female with medical history significant for diabetes, gout, arthritis, HTN, HLD who came into ED by EMS for left ankle pain after a fall 2 days ago while dizzy. Pt found to have medial and lateral malleolar fracture on the left side and does not wish to undergo surgery at her age. Will be NWB with splint in place on LLE. Also being treated for UTI/acute cystitis.    PT Comments  Patient received in bed, she requires cleaning prior to mobility as she is on bedpan. Patient is able to roll with assist of bed rail and min A she is able to sit edge of bed with cga. Patient requires +2 mod A to stand from elevated bed and was able to pivot to recliner with Mod +2 A. Difficulty maintaining NWB on L LE this session. She will continue to benefit from skilled PT to improve functional mobility and independence.      If plan is discharge home, recommend the following: Two people to help with walking and/or transfers;A lot of help with bathing/dressing/bathroom;Assist for transportation;Help with stairs or ramp for entrance   Can travel by private vehicle     No  Equipment Recommendations  Other (comment) (TBD next venue)    Recommendations for Other Services       Precautions / Restrictions Precautions Precautions: Fall Recall of Precautions/Restrictions: Intact Restrictions Weight Bearing Restrictions Per Provider Order: Yes LLE Weight Bearing Per Provider Order: Non weight bearing     Mobility  Bed Mobility Overal bed mobility: Needs Assistance Bed Mobility: Supine to Sit     Supine to sit: Contact guard, HOB elevated, Used rails          Transfers Overall transfer level: Needs assistance Equipment used: Rolling walker (2 wheels) Transfers: Sit to/from Stand, Bed to chair/wheelchair/BSC Sit to Stand: Mod  assist, +2 physical assistance, From elevated surface   Step pivot transfers: Mod assist, +2 physical assistance       General transfer comment: was able to pivot transfer to recliner with mod +2 assist. Difficulty maintaining NWB this session.    Ambulation/Gait                   Stairs             Wheelchair Mobility     Tilt Bed    Modified Rankin (Stroke Patients Only)       Balance Overall balance assessment: Needs assistance Sitting-balance support: Feet supported Sitting balance-Leahy Scale: Good     Standing balance support: Bilateral upper extremity supported, During functional activity, Reliant on assistive device for balance Standing balance-Leahy Scale: Poor Standing balance comment: heavy UE support on RW and Mod A x2 to maintain static and dynamic standing                            Communication Communication Communication: No apparent difficulties  Cognition Arousal: Alert Behavior During Therapy: WFL for tasks assessed/performed   PT - Cognitive impairments: No apparent impairments                         Following commands: Intact      Cueing Cueing Techniques: Verbal cues  Exercises      General Comments  Pertinent Vitals/Pain Pain Assessment Faces Pain Scale: Hurts little more Pain Location: L LE Pain Descriptors / Indicators: Aching, Discomfort, Grimacing Pain Intervention(s): Monitored during session, Repositioned    Home Living                          Prior Function            PT Goals (current goals can now be found in the care plan section) Acute Rehab PT Goals Patient Stated Goal: improve mobility PT Goal Formulation: With patient Time For Goal Achievement: 05/14/24 Potential to Achieve Goals: Good Progress towards PT goals: Progressing toward goals    Frequency    Min 4X/week      PT Plan      Co-evaluation PT/OT/SLP Co-Evaluation/Treatment:  Yes Reason for Co-Treatment: To address functional/ADL transfers;For patient/therapist safety PT goals addressed during session: Mobility/safety with mobility;Balance;Proper use of DME        AM-PAC PT 6 Clicks Mobility   Outcome Measure  Help needed turning from your back to your side while in a flat bed without using bedrails?: A Lot Help needed moving from lying on your back to sitting on the side of a flat bed without using bedrails?: A Lot Help needed moving to and from a bed to a chair (including a wheelchair)?: A Lot Help needed standing up from a chair using your arms (e.g., wheelchair or bedside chair)?: A Lot Help needed to walk in hospital room?: Total Help needed climbing 3-5 steps with a railing? : Total 6 Click Score: 10    End of Session Equipment Utilized During Treatment: Gait belt Activity Tolerance: Patient tolerated treatment well;Patient limited by fatigue Patient left: in chair;with call bell/phone within reach;with chair alarm set Nurse Communication: Mobility status PT Visit Diagnosis: Other abnormalities of gait and mobility (R26.89);Pain;History of falling (Z91.81);Unsteadiness on feet (R26.81) Pain - Right/Left: Left Pain - part of body: Ankle and joints of foot     Time: 8971-8956 PT Time Calculation (min) (ACUTE ONLY): 15 min  Charges:    $Therapeutic Activity: 8-22 mins PT General Charges $$ ACUTE PT VISIT: 1 Visit                     Dinero Chavira, PT, GCS 05/02/24,11:02 AM

## 2024-05-14 ENCOUNTER — Ambulatory Visit (INDEPENDENT_AMBULATORY_CARE_PROVIDER_SITE_OTHER): Payer: Self-pay | Admitting: Podiatry

## 2024-05-14 ENCOUNTER — Ambulatory Visit: Payer: Self-pay | Admitting: Podiatry

## 2024-05-14 ENCOUNTER — Ambulatory Visit (INDEPENDENT_AMBULATORY_CARE_PROVIDER_SITE_OTHER)

## 2024-05-14 VITALS — Ht 66.0 in | Wt 190.0 lb

## 2024-05-14 DIAGNOSIS — S82892A Other fracture of left lower leg, initial encounter for closed fracture: Secondary | ICD-10-CM | POA: Diagnosis not present

## 2024-05-14 DIAGNOSIS — S82892D Other fracture of left lower leg, subsequent encounter for closed fracture with routine healing: Secondary | ICD-10-CM | POA: Diagnosis not present

## 2024-05-14 NOTE — Patient Instructions (Addendum)
 Continue to not put any weight at all on the foot   Once per week while in bed supporting the ankle, the boot may be undone / removed gently and the foot gently washed. OK to apply lotion to skin. Do not get up or move at all with the leg out of the boot. Reapply the ACE wrap gently after this   OK to continue PT for upper leg and body strengthening

## 2024-05-16 ENCOUNTER — Ambulatory Visit: Payer: Self-pay | Admitting: Podiatry

## 2024-05-17 NOTE — Progress Notes (Signed)
  Subjective:  Patient ID: Victoria Edwards, female    DOB: Jan 14, 1929,  MRN: [id number]  Chief Complaint  Patient presents with   Foot Pain    RM 6 Pt is here for left ankle pain fracture. Pt states sharp pain with movement of left foot.    89 y.o. female presents with the above complaint. History confirmed with patient.  She presents today for follow-up on her left ankle fracture.  Overall pain is fairly well-controlled  Objective:  Physical Exam: Pain and edema are on the left ankle medial and lateral no notable skin lesions or skin breakdown she does have moderate venous insufficiency  Radiographs: Multiple views x-ray of left ankle: Relatively well aligned ankle mortise without displacement of fracture fragments Assessment:   1. Closed fracture of left ankle with routine healing, subsequent encounter      Plan:  Patient was evaluated and treated and all questions answered.  Continues to do well.  I recommend transitioning from the splint immobilization to a cam walker boot.  This was dispensed today.  A follow-up with me in 1 month for new radiographs.  Continue nonweightbearing.  Okay to remove boot gently to wash the foot once or twice a week.  Return in about 1 month (around 06/13/2024) for ankle fracture follow up (new xrays).

## 2024-06-20 ENCOUNTER — Ambulatory Visit: Admitting: Podiatry

## 2024-06-20 ENCOUNTER — Ambulatory Visit (INDEPENDENT_AMBULATORY_CARE_PROVIDER_SITE_OTHER)

## 2024-06-20 DIAGNOSIS — S82892D Other fracture of left lower leg, subsequent encounter for closed fracture with routine healing: Secondary | ICD-10-CM | POA: Diagnosis not present

## 2024-06-20 NOTE — Patient Instructions (Signed)
 Continue to only use the heel of the boot he transfers to and from the wheelchair.  In 4 weeks on 07/18/2024 he may begin walking in the boot full body weight with a walker.  Return in 6 weeks for new x-rays.

## 2024-06-24 NOTE — Progress Notes (Signed)
  Subjective:  Patient ID: Victoria Edwards, female    DOB: [birth year],  MRN: [id number]  No chief complaint on file.   89 y.o. female presents with the above complaint. History confirmed with patient.  She is doing well   Objective:  Physical Exam: Pain and edema are on the left ankle medial and lateral no notable skin lesions or skin breakdown she does have moderate venous insufficiency  Radiographs: Multiple views x-ray of left ankle: Alignment is maintained.  Good early secondary bone healing Assessment:   1. Closed fracture of left ankle with routine healing, subsequent encounter      Plan:  Patient was evaluated and treated and all questions answered.  Doing well.  Utilize heel for transfers only for the next 4 weeks.  May then begin weightbearing with walker and assistance in boot.  Follow-up in 6 weeks for new radiographs.  Return in about 6 weeks (around 08/01/2024) for left ankle fracture follow up (new xrays).

## 2024-08-01 ENCOUNTER — Ambulatory Visit: Admitting: Podiatry

## 2024-08-01 ENCOUNTER — Encounter: Payer: Self-pay | Admitting: Podiatry

## 2024-08-10 ENCOUNTER — Encounter: Payer: Self-pay | Admitting: Podiatry

## 2024-08-10 ENCOUNTER — Ambulatory Visit: Admitting: Podiatry

## 2024-08-10 ENCOUNTER — Ambulatory Visit

## 2024-08-10 VITALS — Ht 66.0 in | Wt 190.0 lb

## 2024-08-10 DIAGNOSIS — S82892D Other fracture of left lower leg, subsequent encounter for closed fracture with routine healing: Secondary | ICD-10-CM

## 2024-08-10 NOTE — Progress Notes (Signed)
° °  Chief Complaint  Patient presents with   Ankle Injury    Pt is here to f/u on left ankle due to fracture,she states that she has no feeling so does not know how it feels hopes she able to get out the boot soon.    HPI: 89 y.o. female presenting today for follow-up evaluation of a left ankle fracture sustained on 04/27/2024.  Currently residing at [facility]  Past Medical History:  Diagnosis Date   Arthritis    Diabetes mellitus without complication (HCC)    Gout    Hyperlipidemia    Hypertension    Kidney calculus    Kidney stones     Past Surgical History:  Procedure Laterality Date   APPENDECTOMY     CHOLECYSTECTOMY     TONSILLECTOMY      Allergies[1]   Physical Exam: General: The patient is alert and oriented x3 in no acute distress.  Dermatology: Skin is warm, dry and supple bilateral lower extremities.   Vascular: Skin is warm to touch no erythema.  Chronic edema noted  Neurological: Grossly intact via light touch  Musculoskeletal Exam: Patient ambulatory without a CAM walker currently. No concerns. No pain with palpation or range of motion  Radiographic Exam LT ankle 08/10/2024:  The fracture of the fibula proximal to the tibiotalar joint appears to be healing routinely with significant callus formation around the fracture site.  Small stable avulsion fragment noted to the distal tip of the medial malleolus  Assessment/Plan of Care: 1. LT ankle fracture.  DOI: 04/27/2024  -Patient evaluated.  X-rays reviewed -The patient is now almost 4 months from her initial date of injury.  She reports that she has been putting pressure on the foot without any complications or complaints.  I do believe at this time the patient is okay to discontinue the cam boot and resume FWB in her regular house slippers with the assistance of a walker.  Patient states that she has already been doing this essentially -Return to clinic PRN       Thresa EMERSON Sar,  DPM [facility]  Dr. Thresa EMERSON Sar, DPM    [address], [place] [address]                Office [phone]  Fax [phone]        [1]  Allergies Allergen Reactions   Demerol [Meperidine] Other (See Comments)    Reaction:  Confusion    Shellfish Allergy    Statins Other (See Comments)   Penicillins Itching, Rash and Other (See Comments)    Has patient had a PCN reaction causing immediate rash, facial/tongue/throat swelling, SOB or lightheadedness with hypotension: No Has patient had a PCN reaction causing severe rash involving mucus membranes or skin necrosis: No Has patient had a PCN reaction that required hospitalization No Has patient had a PCN reaction occurring within the last 10 years: No If all of the above answers are NO, then may proceed with Cephalosporin use.
